# Patient Record
Sex: Male | Born: 1955 | Race: Black or African American | Hispanic: No | Marital: Married | Smoking: Never smoker
Health system: Southern US, Community
[De-identification: ages and names within clinical notes are randomized; demographics above are authoritative.]

## PROBLEM LIST (undated history)

## (undated) DIAGNOSIS — M1990 Unspecified osteoarthritis, unspecified site: Secondary | ICD-10-CM

## (undated) DIAGNOSIS — C801 Malignant (primary) neoplasm, unspecified: Secondary | ICD-10-CM

## (undated) DIAGNOSIS — I10 Essential (primary) hypertension: Secondary | ICD-10-CM

## (undated) HISTORY — PX: CHOLECYSTECTOMY: SHX55

---

## 2010-08-20 ENCOUNTER — Emergency Department (HOSPITAL_COMMUNITY): Admission: EM | Admit: 2010-08-20 | Discharge: 2010-08-20 | Payer: Self-pay | Admitting: Emergency Medicine

## 2011-01-01 ENCOUNTER — Encounter: Payer: Self-pay | Admitting: Pain Medicine

## 2011-02-23 LAB — POCT I-STAT, CHEM 8
BUN: 7 mg/dL (ref 6–23)
Calcium, Ion: 1.16 mmol/L (ref 1.12–1.32)
Chloride: 105 meq/L (ref 96–112)
Creatinine, Ser: 1.3 mg/dL (ref 0.4–1.5)
Glucose, Bld: 94 mg/dL (ref 70–99)
HCT: 50 % (ref 39.0–52.0)
Hemoglobin: 17 g/dL (ref 13.0–17.0)
Potassium: 4.2 meq/L (ref 3.5–5.1)
Sodium: 141 meq/L (ref 135–145)
TCO2: 28 mmol/L (ref 0–100)

## 2011-02-23 LAB — POCT CARDIAC MARKERS
CKMB, poc: <1 ng/mL — ABNORMAL LOW (ref 1.0–8.0)
CKMB, poc: <1 ng/mL — ABNORMAL LOW (ref 1.0–8.0)
Myoglobin, poc: 47.4 ng/mL (ref 12–200)
Myoglobin, poc: 53.9 ng/mL (ref 12–200)
Troponin i, poc: <0.05 ng/mL (ref 0.00–0.09)
Troponin i, poc: <0.05 ng/mL (ref 0.00–0.09)

## 2011-09-14 IMAGING — CR DG CHEST 1V PORT
1 series · 1 of 1 positions shown · non-contrast
Comparison: None.

CLINICAL DATA: Left chest pain and neck pain.

CHEST - 1 VIEW

[AP]
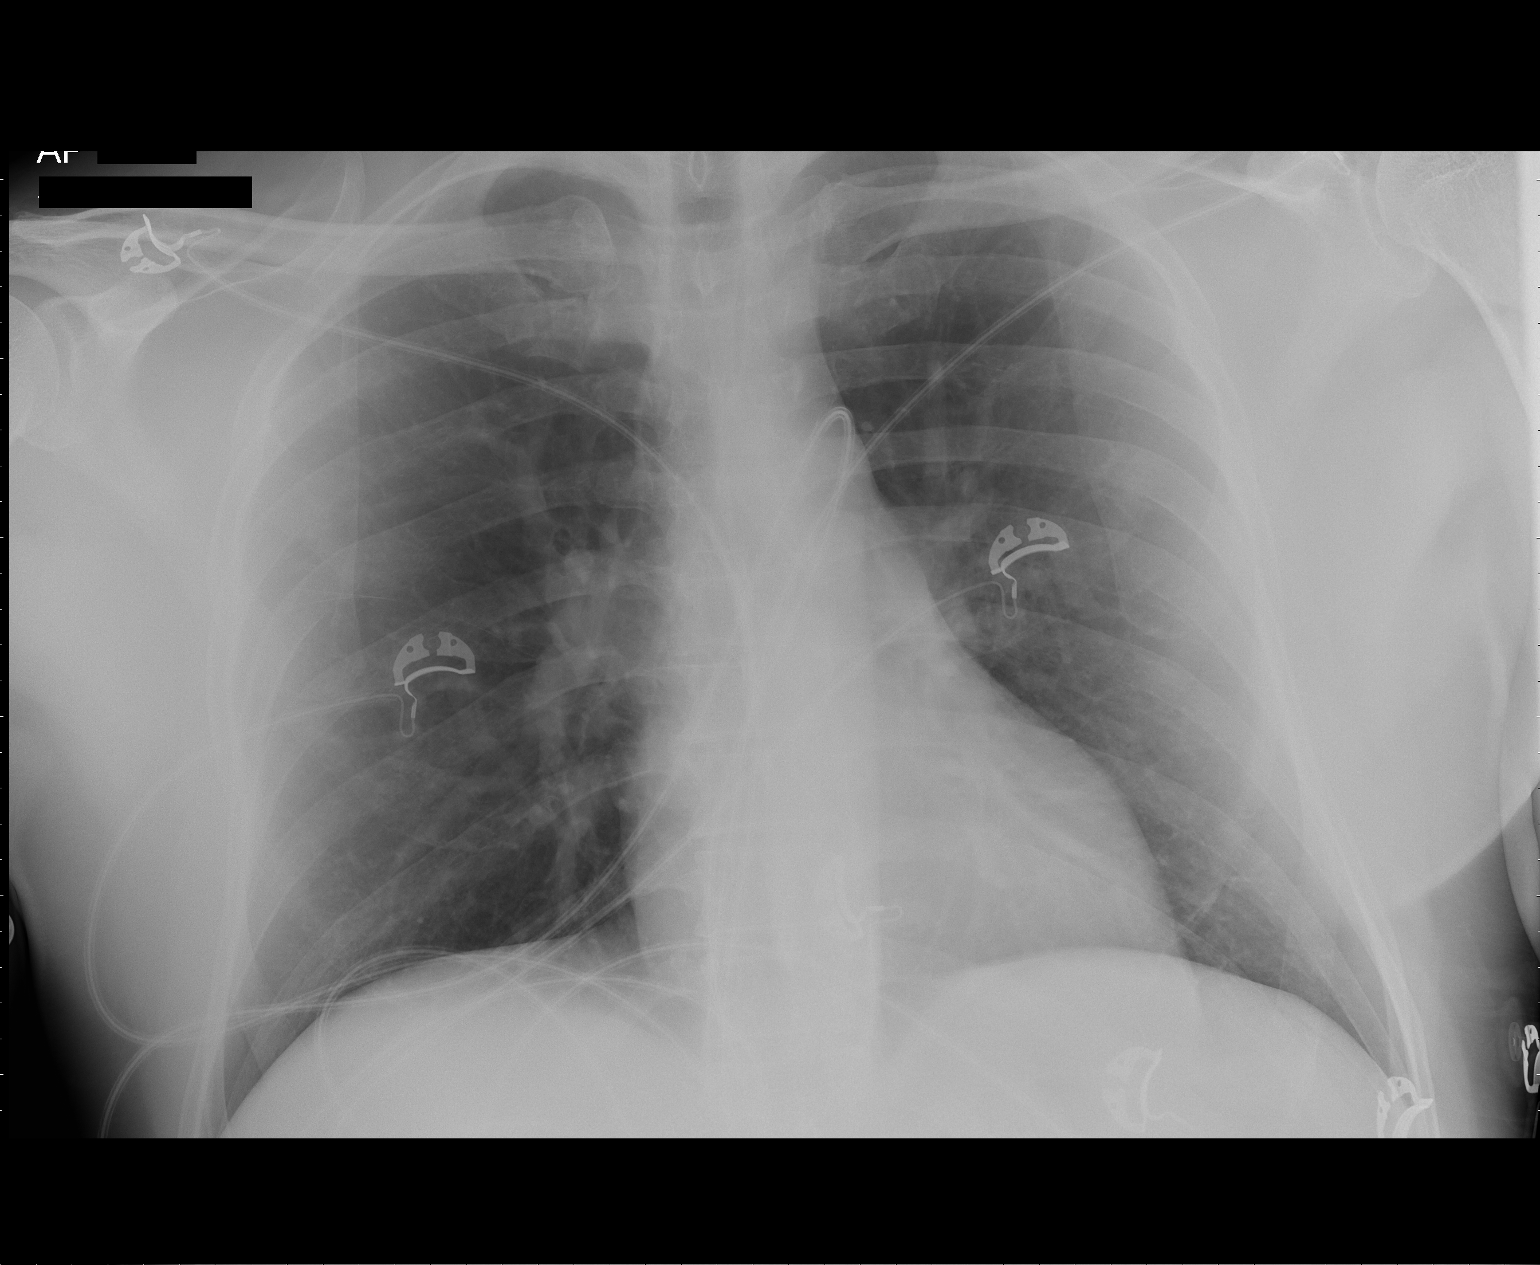

[1 of 1 positions shown; findings below may reference images not displayed]

FINDINGS: The heart size and mediastinal contours are within normal
limits.  Both lungs are clear.
IMPRESSION: No active disease.

## 2017-07-11 HISTORY — PX: PROSTATECTOMY: SHX69

## 2019-01-05 ENCOUNTER — Emergency Department (HOSPITAL_COMMUNITY)

## 2019-01-05 ENCOUNTER — Encounter (HOSPITAL_COMMUNITY): Payer: Self-pay | Admitting: *Deleted

## 2019-01-05 ENCOUNTER — Emergency Department (HOSPITAL_COMMUNITY)
Admission: EM | Admit: 2019-01-05 | Discharge: 2019-01-05 | Disposition: A | Discharge status: Home / Self Care | Attending: Emergency Medicine | Admitting: Emergency Medicine

## 2019-01-05 ENCOUNTER — Other Ambulatory Visit: Payer: Self-pay

## 2019-01-05 DIAGNOSIS — R079 Chest pain, unspecified: Secondary | ICD-10-CM | POA: Diagnosis not present

## 2019-01-05 DIAGNOSIS — Z8546 Personal history of malignant neoplasm of prostate: Secondary | ICD-10-CM | POA: Insufficient documentation

## 2019-01-05 DIAGNOSIS — Z79899 Other long term (current) drug therapy: Secondary | ICD-10-CM | POA: Insufficient documentation

## 2019-01-05 DIAGNOSIS — I10 Essential (primary) hypertension: Secondary | ICD-10-CM | POA: Diagnosis not present

## 2019-01-05 HISTORY — DX: Essential (primary) hypertension: I10

## 2019-01-05 HISTORY — DX: Unspecified osteoarthritis, unspecified site: M19.90

## 2019-01-05 HISTORY — DX: Malignant (primary) neoplasm, unspecified: C80.1

## 2019-01-05 LAB — CBC
HCT: 47.5 % (ref 39.0–52.0)
HEMOGLOBIN: 15.7 g/dL (ref 13.0–17.0)
MCH: 27.7 pg (ref 26.0–34.0)
MCHC: 33.1 g/dL (ref 30.0–36.0)
MCV: 83.9 fL (ref 80.0–100.0)
Platelets: 139 10*3/uL — ABNORMAL LOW (ref 150–400)
RBC: 5.66 MIL/uL (ref 4.22–5.81)
RDW: 12.1 % (ref 11.5–15.5)
WBC: 6.8 10*3/uL (ref 4.0–10.5)
nRBC: 0 % (ref 0.0–0.2)

## 2019-01-05 LAB — BASIC METABOLIC PANEL
Anion gap: 8 (ref 5–15)
BUN: 13 mg/dL (ref 8–23)
CALCIUM: 9.1 mg/dL (ref 8.9–10.3)
CO2: 28 mmol/L (ref 22–32)
Chloride: 104 mmol/L (ref 98–111)
Creatinine, Ser: 1.08 mg/dL (ref 0.61–1.24)
GFR calc Af Amer: >60 mL/min (ref >60)
GLUCOSE: 92 mg/dL (ref 70–99)
POTASSIUM: 3.7 mmol/L (ref 3.5–5.1)
Sodium: 140 mmol/L (ref 135–145)

## 2019-01-05 LAB — I-STAT TROPONIN, ED
TROPONIN I, POC: 0 ng/mL (ref 0.00–0.08)
Troponin i, poc: 0 ng/mL (ref 0.00–0.08)

## 2019-01-05 MED ORDER — SODIUM CHLORIDE 0.9% FLUSH: 3.0000 mL | Freq: Once | INTRAVENOUS | Status: DC

## 2019-01-05 NOTE — ED Notes (Signed)
Patient stepped out with family; stated he would be right back;

## 2019-01-05 NOTE — ED Triage Notes (Signed)
Pt reports he has had central chest discomfort and "fluttering" for about a week. He said he checked his heart rate tonight and it went down to 43 at one point then returned to 65, denies dizziness. Pt was told this week that his hgb levels are low and to follow up with a specialist.

## 2019-01-05 NOTE — ED Notes (Signed)
The pt feels strange tonight  In his chest no pain

## 2019-01-05 NOTE — ED Provider Notes (Addendum)
[facility] EMERGENCY DEPARTMENT Provider Note   CSN: [id number] Arrival date & time: 01/05/19  0156     History   Chief Complaint Chief Complaint  Patient presents with  . Chest Pain    HPI Eugene Marshall is a 63 y.o. male.  HPI Eugene Marshall is a 63 y.o. male presents to emergency department with complaint of chest discomfort.  Patient states for the last week he has had intermittent chest tightness which she describes as "numbness feeling."  He states this comes and goes.  It is not exertional.  Denies any associated shortness of breath.  No dizziness or lightheadedness.  He states last night he had some palpitations as well and when he checked his heart rate he was in the 40s and 30s so he decided to come here.  He denies any heart problems but states that he did have a stress test and cardiac cath done about 10 years ago and was told everything was normal.  He does not smoke cigarettes.  He states his mother has had heart problems otherwise no other family history.  He does not drink alcohol.  He has history of well-controlled hypertension.  No personal cardiac history.  Denies recent travel or surgeries.  No swelling in extremities.  No exertional chest pain or shortness of breath.  Past Medical History:  Diagnosis Date  . Arthritis   . Cancer [facility])    prostate  . Hypertension     There are no active problems to display for this patient.   Past Surgical History:  Procedure Laterality Date  . CHOLECYSTECTOMY          Home Medications    Prior to Admission medications   Medication Sig Start Date End Date Taking? Authorizing Provider  Multiple Vitamin (MULTIVITAMIN WITH MINERALS) TABS tablet Take 1 tablet by mouth daily.   Yes [provider]  tadalafil (CIALIS) 5 MG tablet Take 5 mg by mouth daily. 09/11/18  Yes [provider]    Family History No family history on file.  Social History Social History   Tobacco Use  .  Smoking status: Never Smoker  Substance Use Topics  . Alcohol use: Yes  . Drug use: Yes    Types: Marijuana     Allergies   Sulfacetamide and Penicillins   Review of Systems Review of Systems  Constitutional: Negative for chills and fever.  Respiratory: Positive for chest tightness. Negative for shortness of breath.   Cardiovascular: Negative for chest pain.  Gastrointestinal: Negative for abdominal pain, nausea and vomiting.  Musculoskeletal: Negative for back pain and myalgias.  Skin: Negative for rash.  Neurological: Negative for dizziness and light-headedness.  All other systems reviewed and are negative.    Physical Exam Updated Vital Signs BP (!) 143/79   Pulse (!) 51   Temp 97.8 F (36.6 C) (Oral)   Resp 13   SpO2 98%   Physical Exam Vitals signs and nursing note reviewed.  Constitutional:      General: He is not in acute distress.    Appearance: He is well-developed.  HENT:     Head: Normocephalic and atraumatic.  Eyes:     Conjunctiva/sclera: Conjunctivae normal.  Neck:     Musculoskeletal: Neck supple.  Cardiovascular:     Rate and Rhythm: Normal rate and regular rhythm.     Heart sounds: Normal heart sounds.  Pulmonary:     Effort: Pulmonary effort is normal. No respiratory distress.  Breath sounds: Normal breath sounds. No wheezing or rales.  Abdominal:     General: Bowel sounds are normal. There is no distension.     Palpations: Abdomen is soft.     Tenderness: There is no abdominal tenderness. There is no rebound.  Skin:    General: Skin is warm and dry.  Neurological:     Mental Status: He is alert.      ED Treatments / Results  Labs (all labs ordered are listed, but only abnormal results are displayed) Labs Reviewed  CBC - Abnormal; Notable for the following components:      Result Value   Platelets 139 (*)    All other components within normal limits  BASIC METABOLIC PANEL  I-STAT TROPONIN, ED  I-STAT TROPONIN, ED     EKG EKG Interpretation  Date/Time:  Sunday January 05 2019 02:05:37 EST Ventricular Rate:  63 PR Interval:  146 QRS Duration: 84 QT Interval:  366 QTC Calculation: 374 R Axis:   51 Text Interpretation:  Normal sinus rhythm with sinus arrhythmia Nonspecific T wave abnormality Abnormal ECG No significant change was found Nonspecific T wave abnormality Reconfirmed by Ezequiel Essex [phone]) on 01/05/2019 5:19:58 AM   Radiology Dg Chest 2 View  Result Date: 01/05/2019 CLINICAL DATA:  Central chest pain and pressure x1 week EXAM: CHEST - 2 VIEW COMPARISON:  05/15/2017 FINDINGS: The heart size and mediastinal contours are within normal limits. Both lungs are clear. The visualized skeletal structures are unremarkable. IMPRESSION: No active cardiopulmonary disease. Electronically Signed   By: Ashley Royalty M.D.   On: 01/05/2019 03:01    Procedures Procedures (including critical care time)  Medications Ordered in ED Medications  sodium chloride flush (NS) 0.9 % injection 3 mL (has no administration in time range)     Initial Impression / Assessment and Plan / ED Course  I have reviewed the triage vital signs and the nursing notes.  Pertinent labs & imaging results that were available during my care of the patient were reviewed by me and considered in my medical decision making (see chart for details).     Pt in ED with chest discomfort. Symptoms atypical. ECG showing some T wave abnormalities, no old to compare to. Heart score 3. Labs all unremarkable. Trop x2 negative. Pain is not exertional. Not radiating. Neurovascularly intact. No risks or concern for PE or dissection.   Pt dos have PVCs on the monitor, otherwise no arrhythmias on the monitor over last few hours.   Discussed with Dr. Billy Fischer, pt stable for dc home at this time. Pt will call his PCP tomorrow for close follow up. Discussed strict return precautions. Pt voiced understanding.  Vitals:   01/05/19 0201 01/05/19  0517 01/05/19 0530  BP: (!) 144/79 122/78 (!) 143/79  Pulse: 63 64 (!) 51  Resp: 18 19 13   Temp: 97.6 F (36.4 C) 97.8 F (36.6 C)   TempSrc: Oral Oral   SpO2: 99% 100% 98%      Final Clinical Impressions(s) / ED Diagnoses   Final diagnoses:  Nonspecific chest pain    ED Discharge Orders    None       Jeannett Senior, PA-C 01/05/19 0735    Jeannett Senior, PA-C 01/05/19 7408    Gareth Morgan, MD 01/07/19 2157

## 2019-01-05 NOTE — Discharge Instructions (Addendum)
Please follow up with your family doctor for further testing. Return if worsening symptoms.

## 2019-11-04 ENCOUNTER — Other Ambulatory Visit: Payer: Self-pay

## 2019-11-04 DIAGNOSIS — Z20822 Contact with and (suspected) exposure to covid-19: Secondary | ICD-10-CM

## 2019-11-06 LAB — NOVEL CORONAVIRUS, NAA: SARS-CoV-2, NAA: NOT DETECTED

## 2020-01-22 ENCOUNTER — Other Ambulatory Visit: Payer: Self-pay | Admitting: Orthopedic Surgery

## 2020-01-22 DIAGNOSIS — M259 Joint disorder, unspecified: Secondary | ICD-10-CM

## 2020-01-30 IMAGING — CR DG CHEST 2V
2 series · 2 of 2 positions shown · non-contrast
Comparison: 05/15/2017

CLINICAL DATA: Central chest pain and pressure x1 week

EXAM:
CHEST - 2 VIEW

[chest pa]
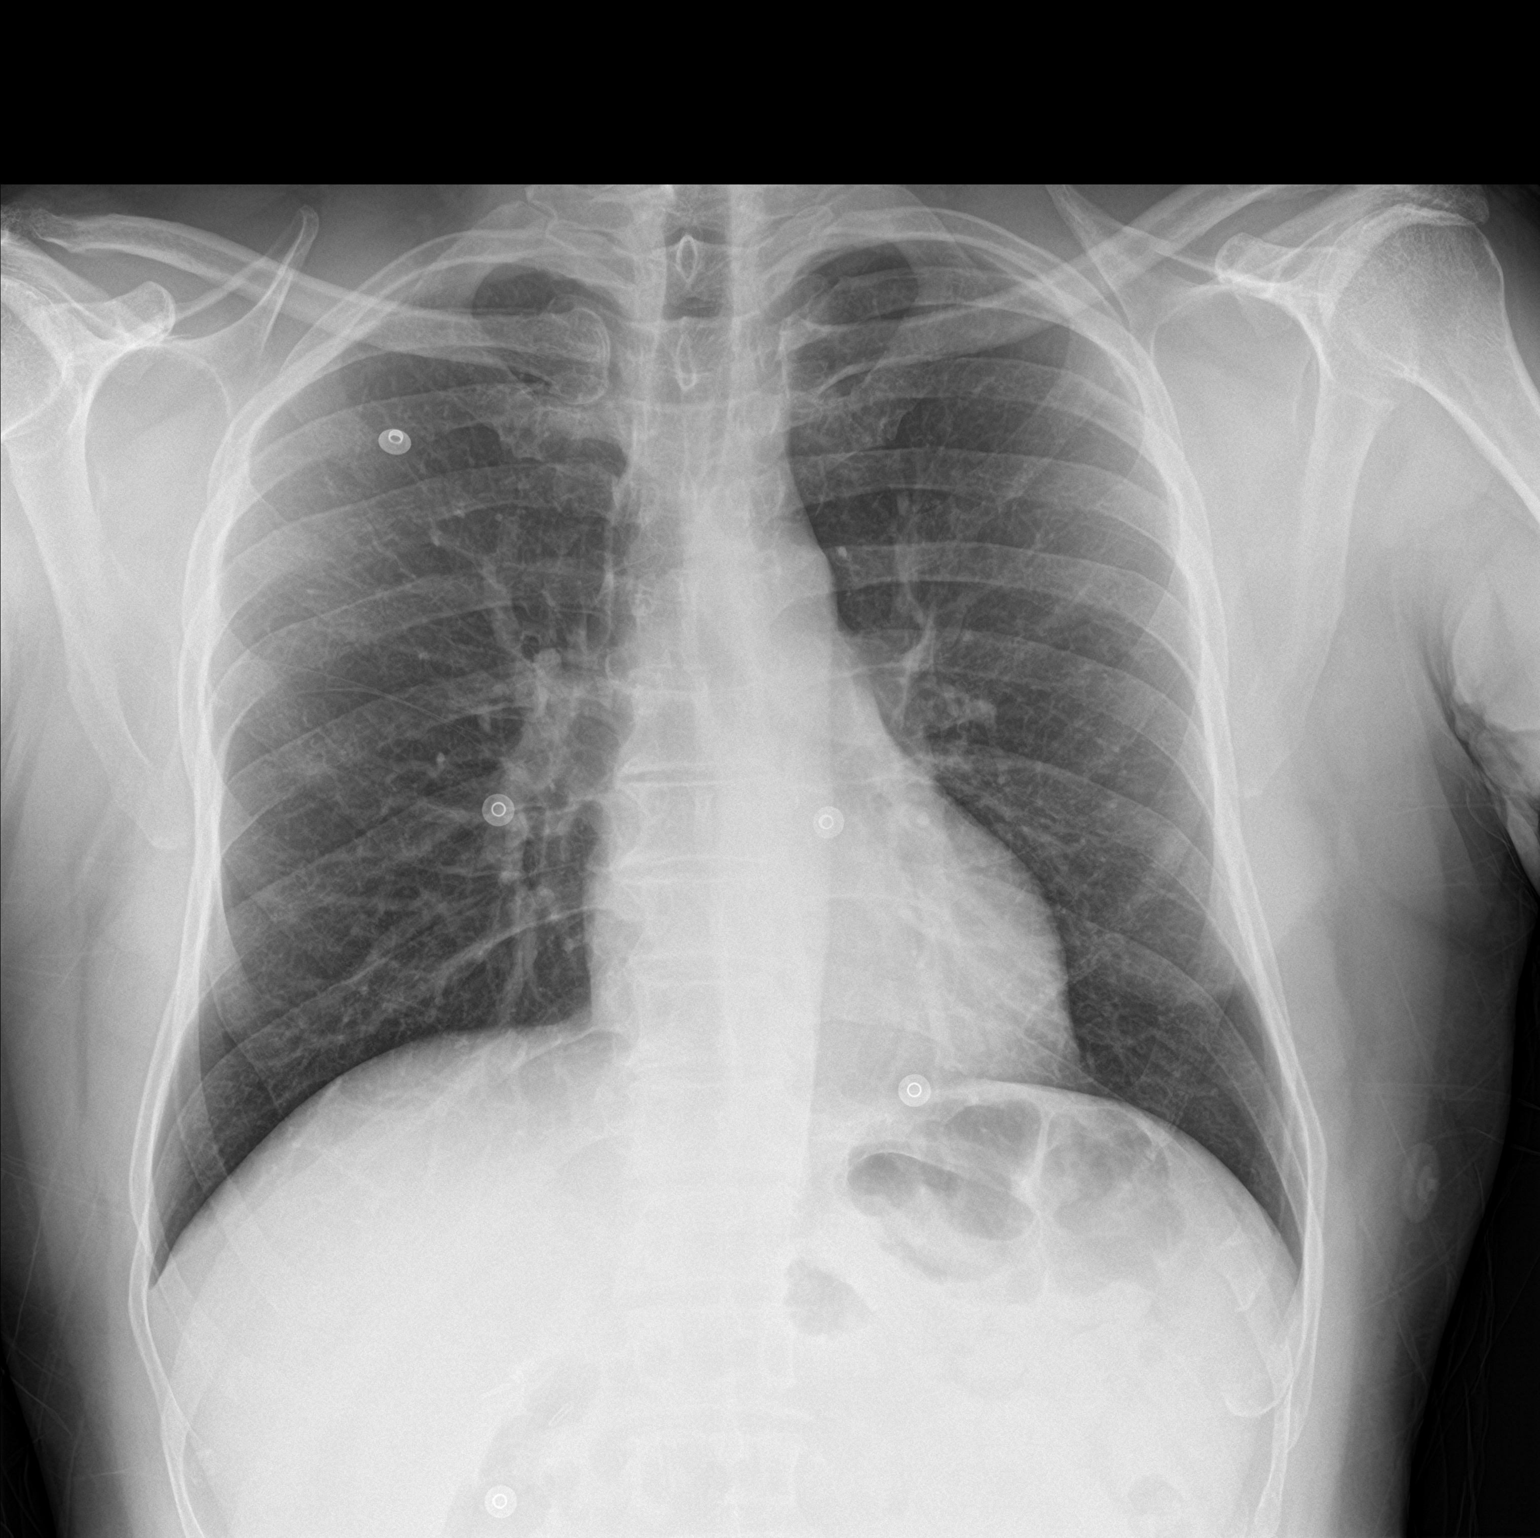

[chest lat]
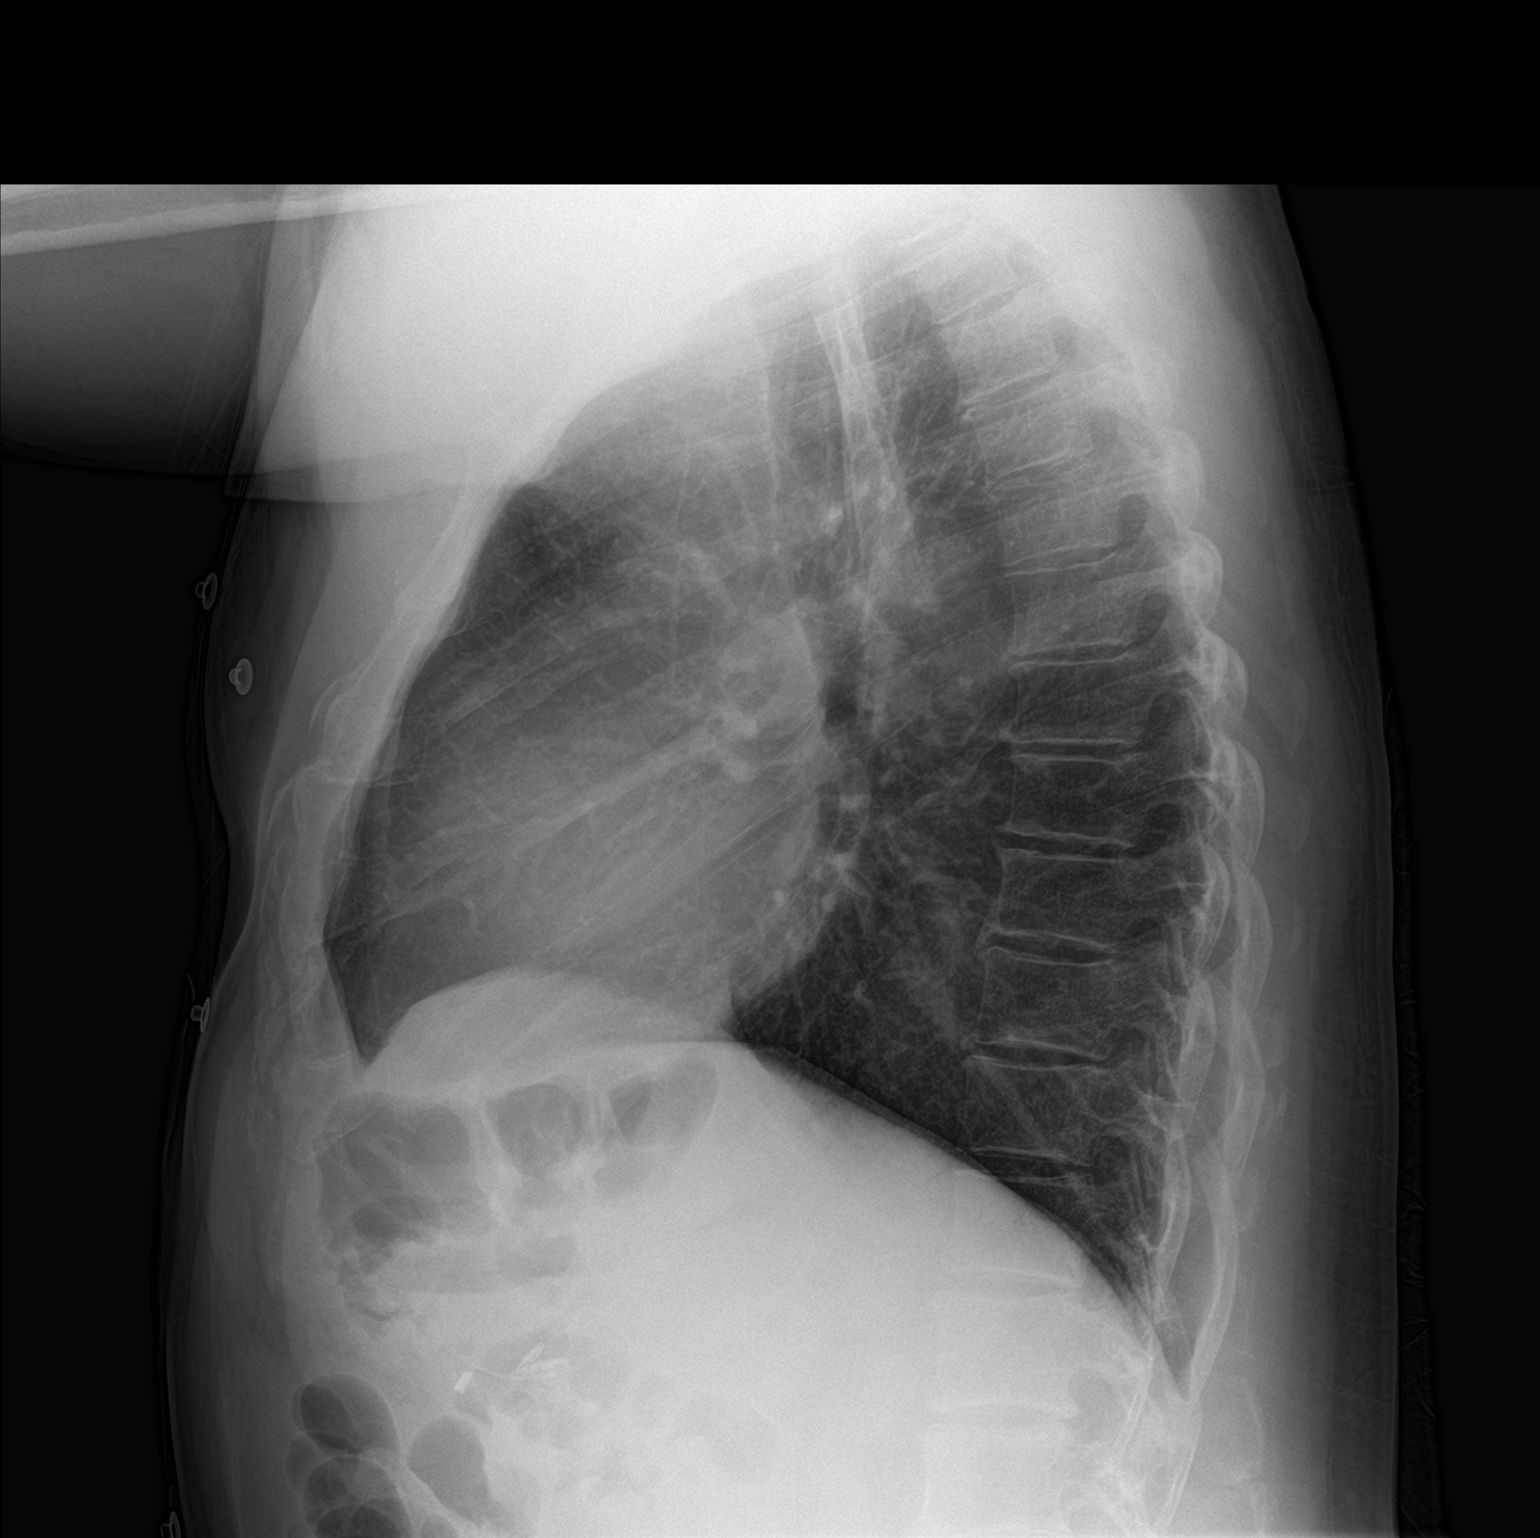

[2 of 2 positions shown; findings below may reference images not displayed]

FINDINGS: The heart size and mediastinal contours are within normal limits.
Both lungs are clear. The visualized skeletal structures are
unremarkable.
IMPRESSION: No active cardiopulmonary disease.

## 2020-01-31 ENCOUNTER — Other Ambulatory Visit: Payer: Self-pay | Admitting: Orthopedic Surgery

## 2020-01-31 DIAGNOSIS — M545 Low back pain, unspecified: Secondary | ICD-10-CM

## 2020-02-09 ENCOUNTER — Ambulatory Visit: Attending: Internal Medicine

## 2020-02-09 DIAGNOSIS — Z23 Encounter for immunization: Secondary | ICD-10-CM

## 2020-02-09 NOTE — Progress Notes (Signed)
   Covid-19 Vaccination Clinic  Name:  Eugene Marshall    MRN: [id number] DOB: [birth year]  02/09/2020  Eugene Marshall was observed post Covid-19 immunization for 15 minutes without incidence. He was provided with Vaccine Information Sheet and instruction to access the V-Safe system.   Eugene Marshall was instructed to call 911 with any severe reactions post vaccine: [name] Kitchen Difficulty breathing  . Swelling of your face and throat  . A fast heartbeat  . A bad rash all over your body  . Dizziness and weakness    Immunizations Administered    Name Date Dose VIS Date Route   Pfizer COVID-19 Vaccine 02/09/2020  2:41 PM 0.3 mL 11/21/2019 Intramuscular   Manufacturer: [place]   Lot: [id number]   [place]: [id number]

## 2020-02-26 ENCOUNTER — Ambulatory Visit
Admission: RE | Admit: 2020-02-26 | Discharge: 2020-02-26 | Disposition: A | Discharge status: Home / Self Care | Source: Ambulatory Visit | Attending: Orthopedic Surgery | Admitting: Orthopedic Surgery

## 2020-02-26 DIAGNOSIS — M545 Low back pain, unspecified: Secondary | ICD-10-CM

## 2020-03-03 ENCOUNTER — Ambulatory Visit: Attending: Internal Medicine

## 2020-03-03 DIAGNOSIS — Z23 Encounter for immunization: Secondary | ICD-10-CM

## 2020-03-03 NOTE — Progress Notes (Signed)
   Covid-19 Vaccination Clinic  Name:  [name]    MRN: [id number] DOB: Jul 19, 1956  03/03/2020  Mr. Presutti was observed post Covid-19 immunization for 15 minutes without incident. He was provided with Vaccine Information Sheet and instruction to access the V-Safe system.   Mr. Baade was instructed to call 911 with any severe reactions post vaccine: [name] Kitchen Difficulty breathing  . Swelling of face and throat  . A fast heartbeat  . A bad rash all over body  . Dizziness and weakness   Immunizations Administered    Name Date Dose VIS Date Route   Pfizer COVID-19 Vaccine 03/03/2020  3:23 PM 0.3 mL 11/21/2019 Intramuscular   Manufacturer: [place]   Lot: [id number]   [place]: [id number]

## 2020-09-07 ENCOUNTER — Ambulatory Visit: Payer: Self-pay | Attending: Internal Medicine

## 2020-09-07 DIAGNOSIS — Z23 Encounter for immunization: Secondary | ICD-10-CM

## 2020-09-07 NOTE — Progress Notes (Signed)
   Covid-19 Vaccination Clinic  Name:  Eugene Marshall    MRN: [id number] DOB: 03/14/1956  09/07/2020  Mr. [name] was observed post Covid-19 immunization for 15 minutes without incident. He was provided with Vaccine Information Sheet and instruction to access the V-Safe system.   Mr. [name] was instructed to call 911 with any severe reactions post vaccine: [name] Kitchen Difficulty breathing  . Swelling of face and throat  . A fast heartbeat  . A bad rash all over body  . Dizziness and weakness

## 2021-02-07 ENCOUNTER — Telehealth: Payer: Self-pay | Admitting: Oncology

## 2021-02-07 NOTE — Telephone Encounter (Signed)
Received a new hem referral from Dr. Ronnald Ramp for thrombocytopenia. Mr. Eugene Marshall has been cld and scheduled to see Dr. Alen Blew on 3/9 at 11am. Pt aware to arrrive 20 minutes early.

## 2021-02-16 ENCOUNTER — Other Ambulatory Visit: Payer: Self-pay

## 2021-02-16 ENCOUNTER — Inpatient Hospital Stay: Payer: Medicare Other | Attending: Oncology | Admitting: Oncology

## 2021-02-16 VITALS — BP 146/87 | HR 67 | Temp 98.1°F | Resp 17 | Wt 175.2 lb

## 2021-02-16 DIAGNOSIS — D696 Thrombocytopenia, unspecified: Secondary | ICD-10-CM | POA: Diagnosis present

## 2021-02-16 DIAGNOSIS — Z8546 Personal history of malignant neoplasm of prostate: Secondary | ICD-10-CM | POA: Diagnosis not present

## 2021-02-16 NOTE — Progress Notes (Signed)
Reason for the request:    Thrombocytopenia  HPI: I was asked by Dr. Ronnald Ramp to evaluate Eugene Marshall for the evaluation of low platelet count.  He is a 65 year old man with history of hypertension and history of prostate cancer diagnosed in 2019.  He underwent a radical prostatectomy at [facility].  He has been told in the past that he had thrombocytopenia and was evaluated by hematology at [facility] diagnosed with platelet clumping. Laboratory testing on January 27 showed a platelet count of 68.  On February 3 CBC was repeated and showed a platelet count of 117 and was 119 in February 23rd 2022.  Previous CBC on January 2020 showed a platelet count of 139.  On January 10 of 04/30/2019 spell count was 75 and increase spontaneously in July 20 to 138 and subsequently to 843 in May 2021. At [place] he had a CBC done in 2017 platelet count 139.  Clinically, he reports feeling well without any complaints.  Denies any hematochezia, melena or hemoptysis.  He remains active and continues to attempt activities of daily living to live independently.  He does not report any headaches, blurry vision, syncope or seizures. Does not report any fevers, chills or sweats.  Does not report any cough, wheezing or hemoptysis.  Does not report any chest pain, palpitation, orthopnea or leg edema.  Does not report any nausea, vomiting or abdominal pain.  Does not report any constipation or diarrhea.  Does not report any skeletal complaints.    Does not report frequency, urgency or hematuria.  Does not report any skin rashes or lesions. Does not report any heat or cold intolerance.  Does not report any lymphadenopathy or petechiae.  Does not report any anxiety or depression.  Remaining review of systems is negative.    Past Medical History:  Diagnosis Date   Arthritis    Cancer ([place])    prostate   Hypertension   :  Past Surgical History:  Procedure Laterality Date   CHOLECYSTECTOMY    :   Current  Outpatient Medications:    Multiple Vitamin (MULTIVITAMIN WITH MINERALS) TABS tablet, Take 1 tablet by mouth daily., Disp: , Rfl:    tadalafil (CIALIS) 5 MG tablet, Take 5 mg by mouth daily., Disp: , Rfl: :  Allergies  Allergen Reactions   Sulfacetamide Hives   Penicillins Other (See Comments)    Other reaction(s): Other (See Comments) Stopped breathing Stopped breathing Stopped breathing   :  No family history on file.:  Social History   Socioeconomic History   Marital status: Married    Spouse name: Not on file   Number of children: Not on file   Years of education: Not on file   Highest education level: Not on file  Occupational History   Not on file  Tobacco Use   Smoking status: Never Smoker   Smokeless tobacco: Not on file  Substance and Sexual Activity   Alcohol use: Yes   Drug use: Yes    Types: Marijuana   Sexual activity: Not on file  Other Topics Concern   Not on file  Social History Narrative   Not on file   Social Determinants of Health   Financial Resource Strain: Not on file  Food Insecurity: Not on file  Transportation Needs: Not on file  Physical Activity: Not on file  Stress: Not on file  Social Connections: Not on file  Intimate Partner Violence: Not on file  :  Pertinent items are  noted in HPI.  Exam: Blood pressure (!) 146/87, pulse 67, temperature 98.1 F (36.7 C), temperature source Tympanic, resp. rate 17, weight 175 lb 3.2 oz (79.5 kg), SpO2 100 %.  ECOG 1  General appearance: alert and cooperative appeared without distress. Head: atraumatic without any abnormalities. Eyes: conjunctivae/corneas clear. PERRL.  Sclera anicteric. Throat: lips, mucosa, and tongue normal; without oral thrush or ulcers. Resp: clear to auscultation bilaterally without rhonchi, wheezes or dullness to percussion. Cardio: regular rate and rhythm, S1, S2 normal, no murmur, click, rub or gallop GI: soft, non-tender; bowel sounds normal; no  masses,  no organomegaly Skin: Skin color, texture, turgor normal. No rashes or lesions Lymph nodes: Cervical, supraclavicular, and axillary nodes normal. Neurologic: Grossly normal without any motor, sensory or deep tendon reflexes. Musculoskeletal: No joint deformity or effusion.    Assessment and Plan:   65 year old man with:  1.  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  His most recent CBC obtained on February 02, 2021 showed a platelet count of 122 with normal white cell count, hemoglobin and differential.  Differential diagnosis of these findings were discussed at this time.  Given the mild and fluctuating nature of his thrombocytopenia it is unlikely we are dealing with a malignant hematological condition.  Conditions such as ITP, splenic sequestration, pseudothrombocytopenia due to clumping is more likely at this time.  Conditions such as leukemia, lymphoma, MDS among others are considered unlikely.  Given the fact that his platelet count is above 100,000 without any signs or symptoms of bleeding, no intervention is needed at this time.  I recommended repeating his CBC in 6 months and if the pattern continues to hold no further work-up is needed.  I see no need for any growth factor support, transfusion or any further testing at this time including a bone marrow biopsy.  I see no evidence of a hematological disorder at this time.  Anticipates his platelet count will continue to fluctuate based on readings and likely related to specimen collection rather than actual hematological disorder.   2.  Follow-up: I am happy to see him in the future as needed.  45  minutes were dedicated to this visit. The time was spent on reviewing laboratory data, discussing treatment options, discussing differential diagnosis and answering questions regarding future plan.    A copy of this consult has been forwarded to  the requesting physician.

## 2021-03-22 IMAGING — MR MR LUMBAR SPINE W/O CM
4 of 5 series · 24 of 48 positions shown · non-contrast
Comparison: None.

CLINICAL DATA: Low back pain radiating to the right hip and buttock

EXAM:
MRI LUMBAR SPINE WITHOUT CONTRAST
TECHNIQUE: Multiplanar, multisequence MR imaging of the lumbar spine was
performed. No intravenous contrast was administered.

[Series 2: T2 · sagittal · 4.0mm · 0.55mm/px · 6 of 16 slices shown (1 of 2)]
[im 1/16]
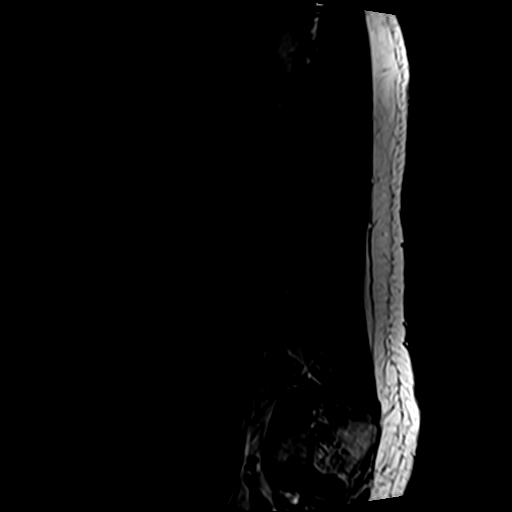
[im 4/16]
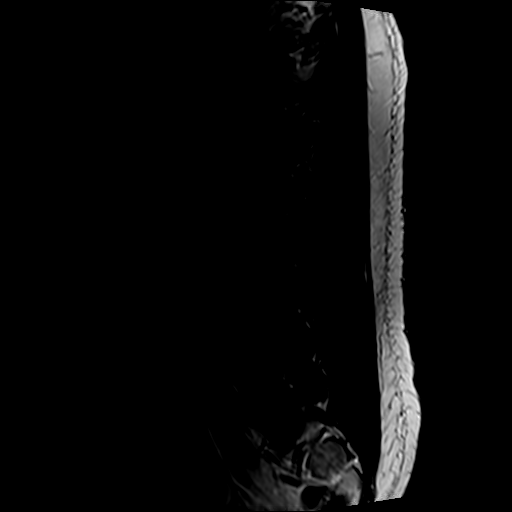
[im 7/16]
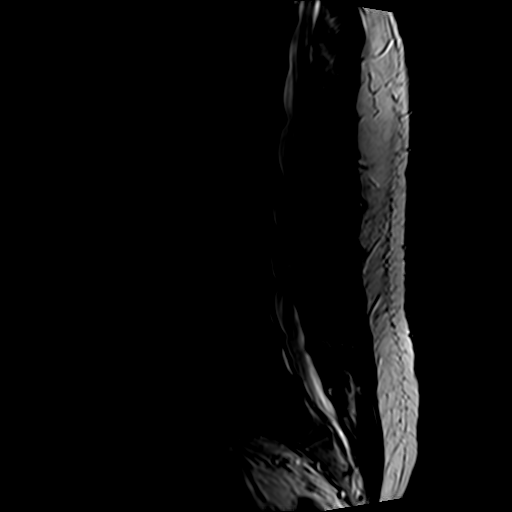
[im 10/16]
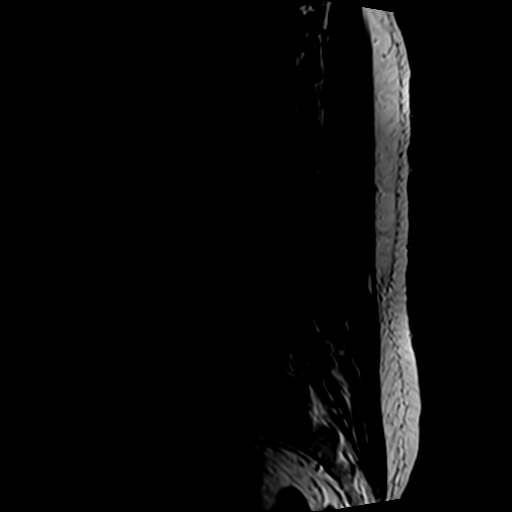
[im 13/16]
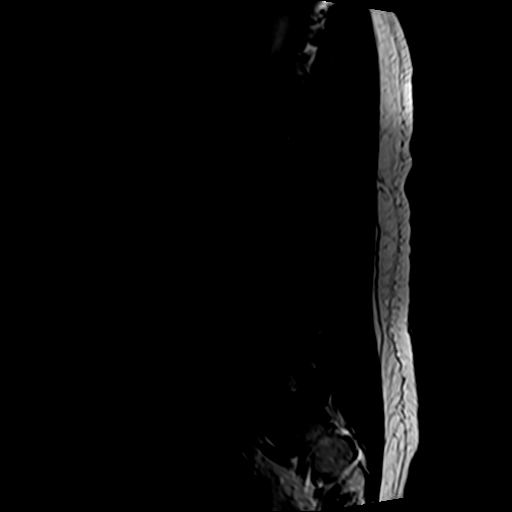
[im 16/16]
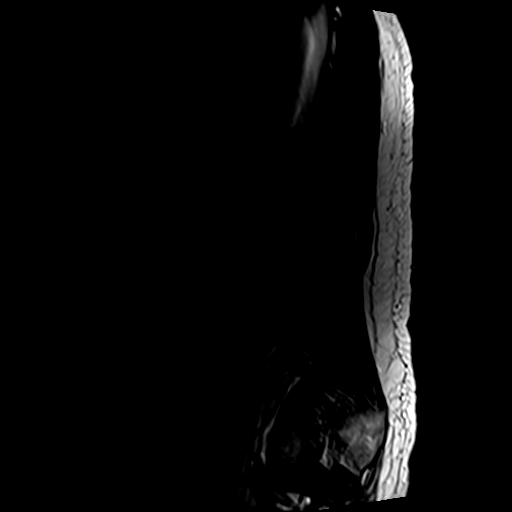

[Series 4: T1 · sagittal · 4.0mm · 0.55mm/px · 7 of 16 slices shown (1 of 2)]
[im 1/16]
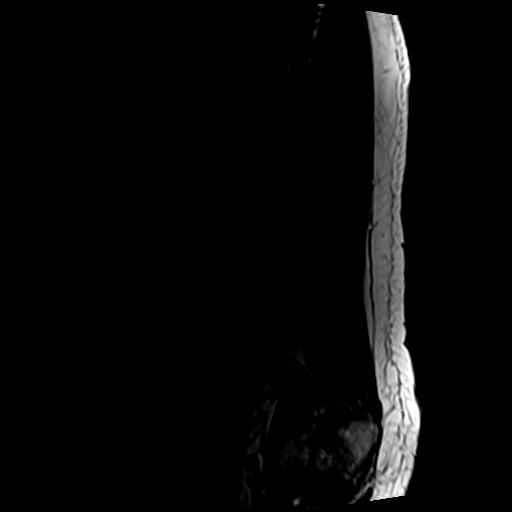
[im 3/16]
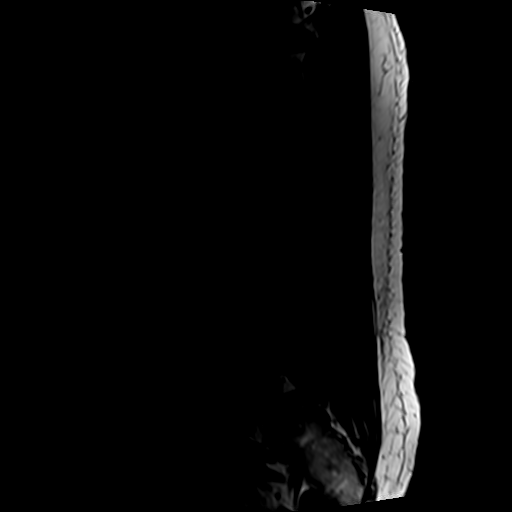
[im 6/16]
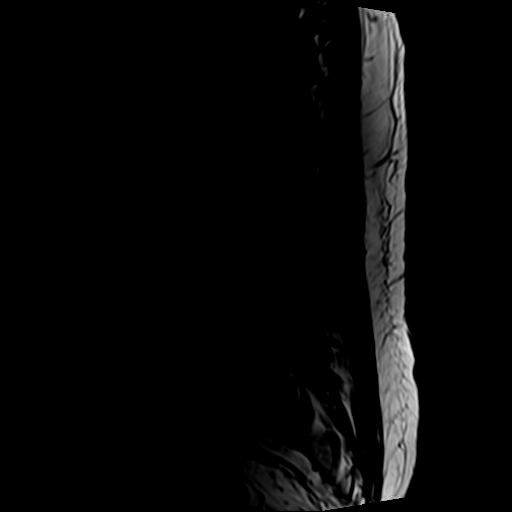
[im 8/16]
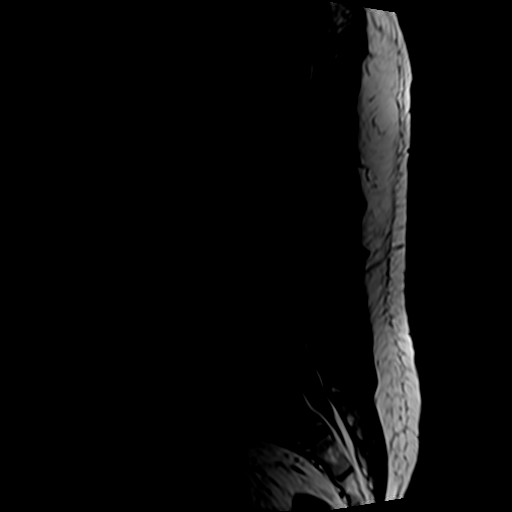
[im 11/16]
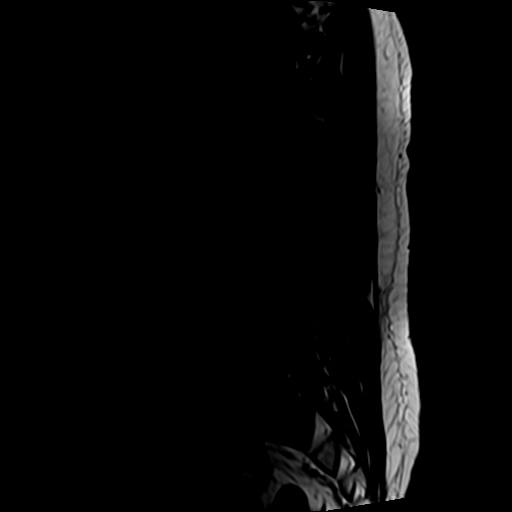
[im 13/16]
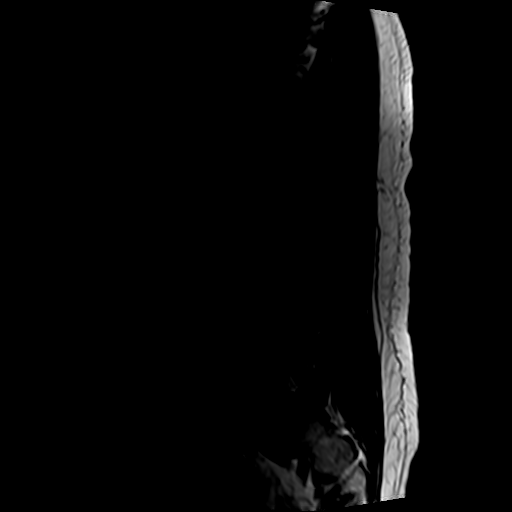
[im 16/16]
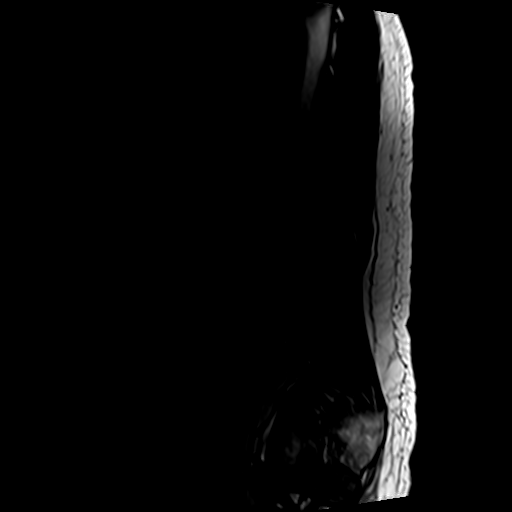

[Series 5: T2 · axial · 4.0mm · 0.70mm/px · z∈[-67,+129]mm · 8 of 35 slices shown (2 of 2)]
[im 1/35]
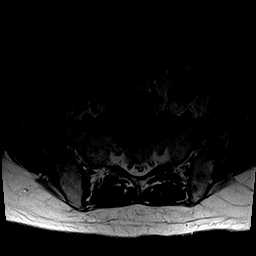
[im 6/35]
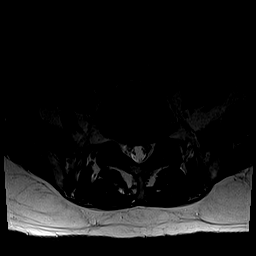
[im 11/35]
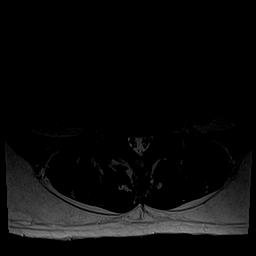
[im 16/35]
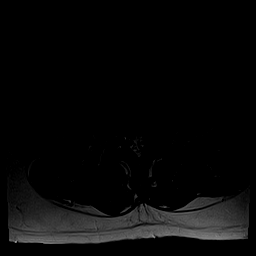
[im 19/35]
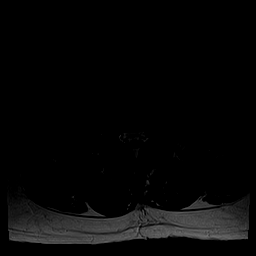
[im 24/35]
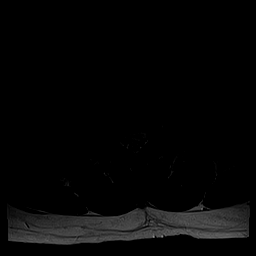
[im 29/35]
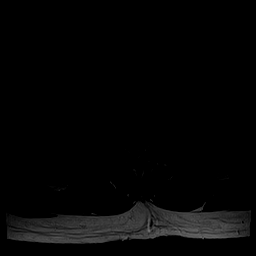
[im 35/35]
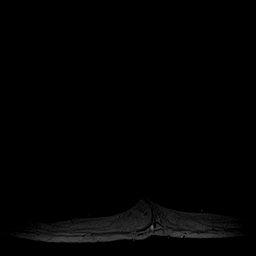

[Series 6: T1 · axial · 4.0mm · 0.35mm/px · z∈[-42,+98]mm · 3 of 35 slices shown (2 of 2)]
[im 6/35]
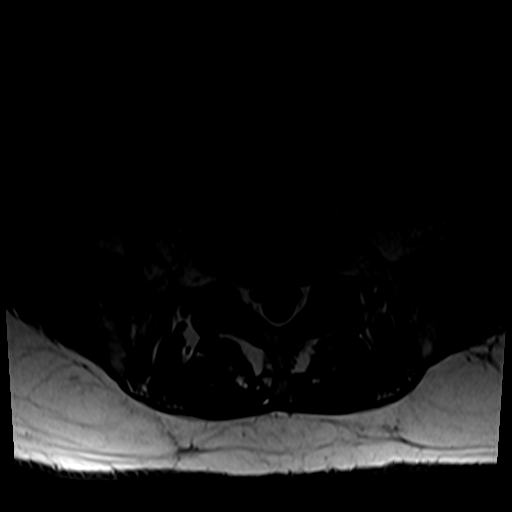
[im 19/35]
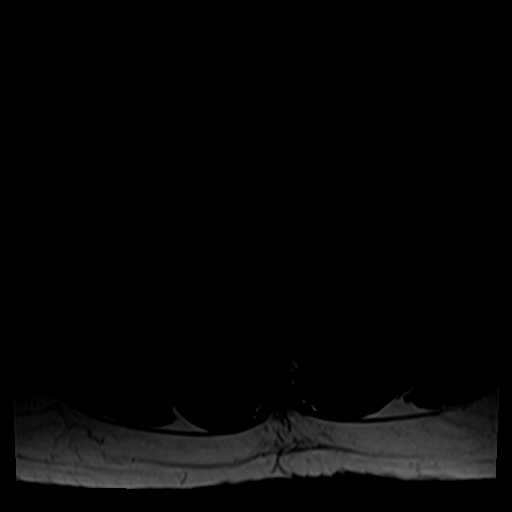
[im 29/35]
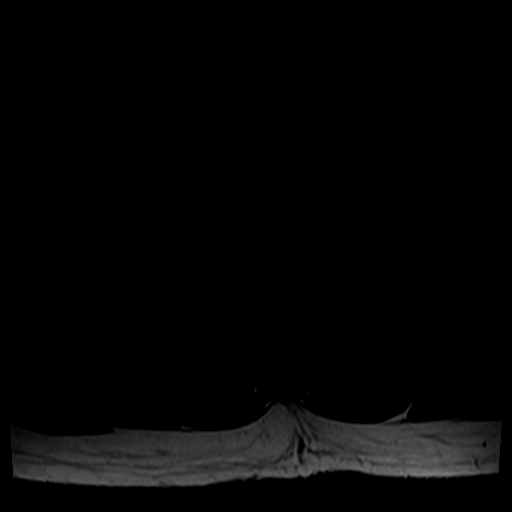

[24 of 48 positions shown; findings below may reference images not displayed]

FINDINGS: Segmentation:  Standard.

Alignment:  Physiologic.

Vertebrae:  No fracture, evidence of discitis, or bone lesion.

Conus medullaris and cauda equina: Conus extends to the T12 level.
Conus and cauda equina appear normal.

Paraspinal and other soft tissues: Negative.

Disc levels:

T12-L1: Mild disc bulge. No stenosis.

L1-L2: Mild disc bulge with superimposed left subarticular
protrusion. Minimal narrowing of the left lateral recess. No central
spinal canal or neural foraminal stenosis.

L2-L3: Left asymmetric disc bulge with endplate spurring. No spinal
canal stenosis. Mild left foraminal narrowing.

L3-L4: Disc space narrowing with diffuse bulge. Bilateral lateral
recess narrowing without central spinal canal stenosis. Moderate
bilateral neural foraminal stenosis.

L4-L5: Disc space narrowing with endplate spurring and disc bulge.
No spinal canal stenosis. Moderate right, mild left facet
hypertrophy. Severe bilateral neural foraminal stenosis.

L5-S1: Mild disc bulge. Moderate bilateral neural foraminal
stenosis.

Visualized sacrum: Normal.
IMPRESSION: Moderate-to-severe neural foraminal stenosis at the L3-S1 levels.

## 2022-07-05 NOTE — H&P (Signed)
HIP ARTHROPLASTY ADMISSION H&P  Patient ID: Eugene Marshall MRN: [id number] DOB/AGE: 67/22/1957 67 y.o.  Chief Complaint: right hip pain.  Planned Procedure Date: 07/18/22 Medical Clearance by Dr. Kristie Cowman   Cardiac Clearance by Dr. Tonia Ghent   HPI: Eugene Marshall is a 66 y.o. 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 The patient has a history of pain and functional disability in the right hip due to arthritis and has failed non-surgical conservative treatments for greater than 12 weeks to include NSAID's and/or analgesics, supervised PT with diminished ADL's post treatment, and activity modification.  Onset of symptoms was gradual, starting 3 years ago with gradually worsening course since that time. The patient noted no past surgery on the right hip.  Patient currently rates pain at 10 out of 10 with activity. Patient has night pain, worsening of pain with activity and weight bearing, and pain that interferes with activities of daily living.  Patient has evidence of subchondral sclerosis, periarticular osteophytes, joint space narrowing, and osteolysis of the femoral head  by imaging studies.  There is no active infection.  Past Medical History:  Diagnosis Date   Arthritis    Cancer [facility])    prostate   Hypertension    Past Surgical History:  Procedure Laterality Date   CHOLECYSTECTOMY     Allergies  Allergen Reactions   Sulfacetamide Hives   Penicillins Other (See Comments)    Stopped breathing    Prior to Admission medications   Medication Sig Start Date End Date Taking? Authorizing Provider  Multiple Vitamins-Minerals (EMERGEN-C VITAMIN C PO) Take 1 tablet by mouth 3 (three) times a week.   Yes [provider]  naproxen sodium (ALEVE) 220 MG tablet Take 440 mg by mouth at bedtime as needed (sleep).   Yes [provider]   Social History   Socioeconomic History   Marital status: Married    Spouse name: Not on file   Number of children: Not on  file   Years of education: Not on file   Highest education level: Not on file  Occupational History   Not on file  Tobacco Use   Smoking status: Never   Smokeless tobacco: Not on file  Substance and Sexual Activity   Alcohol use: Yes   Drug use: Yes    Types: Marijuana   Sexual activity: Not on file  Other Topics Concern   Not on file  Social History Narrative   Not on file   Social Determinants of Health   Financial Resource Strain: Not on file  Food Insecurity: Not on file  Transportation Needs: Not on file  Physical Activity: Not on file  Stress: Not on file  Social Connections: Not on file   No family history on file.  ROS: Currently denies lightheadedness, dizziness, Fever, chills, CP, SOB.   No personal history of DVT, PE, MI, or CVA. No loose teeth or dentures All other systems have been reviewed and were otherwise currently negative with the exception of those mentioned in the HPI and as above.  Objective: Vitals: Ht: 5'10" Wt: 176.6 lbs Temp: 97.5 BP: 142/91 Pulse: 74 O2 96% on room air.   Physical Exam: General: Alert, NAD. Trendelenberg Gait  HEENT: EOMI, Good Neck Extension  Pulm: No increased work of breathing.  Clear B/L A/P w/o crackle or wheeze.  CV: RRR, No m/g/r appreciated  GI: soft, NT, ND. BS x 4 quadrants Neuro: CN II-XII grossly intact without focal deficit.  Sensation intact distally  Skin: No lesions in the area of chief complaint MSK/Surgical Site: non- TTP. Hip pain with ROM. + Stinchfield. - SLR. + FABER/FADIR. 5/5 strength.  NVI.    Imaging Review Plain radiographs demonstrate severe degenerative joint disease of the right hip.   The bone quality appears to be poor for age and reported activity level.  Preoperative templating of the joint replacement has been completed, documented, and submitted to the Operating Room personnel in order to optimize intra-operative equipment management.  Assessment: OA RIGHT HIP Active Problems:   * No  active hospital problems. *   Plan: Plan for Procedure(s): TOTAL HIP ARTHROPLASTY ANTERIOR APPROACH  The patient history, physical exam, clinical judgement of the provider and imaging are consistent with end stage degenerative joint disease and total joint arthroplasty is deemed medically necessary. The treatment options including medical management, injection therapy, and arthroplasty were discussed at length. The risks and benefits of Procedure(s): TOTAL HIP ARTHROPLASTY ANTERIOR APPROACH were presented and reviewed.  The risks of nonoperative treatment, versus surgical intervention including but not limited to continued pain, aseptic loosening, stiffness, dislocation/subluxation, infection, bleeding, nerve injury, blood clots, cardiopulmonary complications, morbidity, mortality, among others were discussed. The patient verbalizes understanding and wishes to proceed with the plan.  Patient is being admitted for surgery, pain control, PT, prophylactic antibiotics, VTE prophylaxis, progressive ambulation, ADL's and discharge planning.   Dental prophylaxis discussed and recommended for 2 years postoperatively.  The patient does meet the criteria for TXA which will be used perioperatively.   ASA 81 mg BID will be used postoperatively for DVT prophylaxis in addition to SCDs, and early ambulation. Plan for Oxycodone, Mobic, Tylenol for pain.   Robaxin for muscle spasm.  Zofran for nausea and vomiting. Colace for constipation prevention Pharmacy- [facility] on [address] The patient is planning to be discharged home with OPPT and into the care of his wife Eugene Marshall who can be reached at [phone] Follow up appt 08/02/22 at 4:15pm     Alisa Graff Office [phone] 07/05/2022 6:33 PM

## 2022-07-05 NOTE — Patient Instructions (Signed)
DUE TO COVID-19 ONLY TWO VISITORS  (aged 67 and older)  ARE ALLOWED TO COME WITH YOU AND STAY IN THE WAITING ROOM ONLY DURING PRE OP AND PROCEDURE.   **NO VISITORS ARE ALLOWED IN THE SHORT STAY AREA OR RECOVERY ROOM!!**  IF YOU WILL BE ADMITTED INTO THE HOSPITAL YOU ARE ALLOWED ONLY FOUR SUPPORT PEOPLE DURING VISITATION HOURS ONLY (7 AM -8PM)   The support person(s) must pass our screening, gel in and out, and wear a mask at all times, including in the patient's room. Patients must also wear a mask when staff or their support person are in the room. Visitors GUEST BADGE MUST BE WORN VISIBLY  One adult visitor may remain with you overnight and MUST be in the room by 8 P.M.     Your procedure is scheduled on: 07/18/22   Report to [facility] Main Entrance    Report to admitting at : 5:30 AM   Call this number if you have problems the morning of surgery [phone]   Do not eat food :After Midnight.   After Midnight you may have the following liquids until : 4:30 AM DAY OF SURGERY  Water Black Coffee (sugar ok, NO MILK/CREAM OR CREAMERS)  Tea (sugar ok, NO MILK/CREAM OR CREAMERS) regular and decaf                             Plain Jell-O (NO RED)                                           Fruit ices (not with fruit pulp, NO RED)                                     Popsicles (NO RED)                                                                  Juice: apple, WHITE grape, WHITE cranberry Sports drinks like Gatorade (NO RED)              Drink Ensure drink AT : 4:30 AM the morning of surgery.     The day of surgery:  Drink ONE (1) Pre-Surgery Clear Ensure or G2 at AM the morning of surgery. Drink in one sitting. Do not sip.  This drink was given to you during your hospital  pre-op appointment visit. Nothing else to drink after completing the  Pre-Surgery Clear Ensure or G2.          If you have questions, please contact your surgeon's office.    Oral Hygiene is also  important to reduce your risk of infection.                                    Remember - BRUSH YOUR TEETH THE MORNING OF SURGERY WITH YOUR REGULAR TOOTHPASTE   Do NOT smoke after Midnight   Take these medicines the morning of surgery with A  SIP OF WATER: N/A  DO NOT TAKE ANY ORAL DIABETIC MEDICATIONS DAY OF YOUR SURGERY  Bring CPAP mask and tubing day of surgery.                              You may not have any metal on your body including hair pins, jewelry, and body piercing             Do not wear lotions, powders, perfumes/cologne, or deodorant              Men may shave face and neck.   Do not bring valuables to the hospital. [place].   Contacts, dentures or bridgework may not be worn into surgery.   Bring small overnight bag day of surgery.   DO NOT [place]. PHARMACY WILL DISPENSE MEDICATIONS LISTED ON YOUR MEDICATION LIST TO YOU DURING YOUR ADMISSION [facility]!    Patients discharged on the day of surgery will not be allowed to drive home.  Someone NEEDS to stay with you for the first 24 hours after anesthesia.   Special Instructions: Bring a copy of your healthcare power of attorney and living will documents         the day of surgery if you haven't scanned them before.              Please read over the following fact sheets you were given: IF YOU HAVE QUESTIONS ABOUT YOUR PRE-OP INSTRUCTIONS PLEASE CALL [phone]     [facility] - Preparing for Surgery Before surgery, you can play an important role.  Because skin is not sterile, your skin needs to be as free of germs as possible.  You can reduce the number of germs on your skin by washing with CHG (chlorahexidine gluconate) soap before surgery.  CHG is an antiseptic cleaner which kills germs and bonds with the skin to continue killing germs even after washing. Please DO NOT use if you have an allergy to CHG or antibacterial  soaps.  If your skin becomes reddened/irritated stop using the CHG and inform your nurse when you arrive at Short Stay. Do not shave (including legs and underarms) for at least 48 hours prior to the first CHG shower.  You may shave your face/neck. Please follow these instructions carefully:  1.  Shower with CHG Soap the night before surgery and the  morning of Surgery.  2.  If you choose to wash your hair, wash your hair first as usual with your  normal  shampoo.  3.  After you shampoo, rinse your hair and body thoroughly to remove the  shampoo.                           4.  Use CHG as you would any other liquid soap.  You can apply chg directly  to the skin and wash                       Gently with a scrungie or clean washcloth.  5.  Apply the CHG Soap to your body ONLY FROM THE NECK DOWN.   Do not use on face/ open  Wound or open sores. Avoid contact with eyes, ears mouth and genitals (private parts).                       Wash face,  Genitals (private parts) with your normal soap.             6.  Wash thoroughly, paying special attention to the area where your surgery  will be performed.  7.  Thoroughly rinse your body with warm water from the neck down.  8.  DO NOT shower/wash with your normal soap after using and rinsing off  the CHG Soap.                9.  Pat yourself dry with a clean towel.            10.  Wear clean pajamas.            11.  Place clean sheets on your bed the night of your first shower and do not  sleep with pets. Day of Surgery : Do not apply any lotions/deodorants the morning of surgery.  Please wear clean clothes to the hospital/surgery center.  FAILURE TO FOLLOW THESE INSTRUCTIONS MAY RESULT IN THE CANCELLATION OF YOUR SURGERY PATIENT SIGNATURE_________________________________  NURSE SIGNATURE__________________________________  ________________________________________________________________________   Adam Phenix  An  incentive spirometer is a tool that can help keep your lungs clear and active. This tool measures how well you are filling your lungs with each breath. Taking long deep breaths may help reverse or decrease the chance of developing breathing (pulmonary) problems (especially infection) following: A long period of time when you are unable to move or be active. BEFORE THE PROCEDURE  If the spirometer includes an indicator to show your best effort, your nurse or respiratory therapist will set it to a desired goal. If possible, sit up straight or lean slightly forward. Try not to slouch. Hold the incentive spirometer in an upright position. INSTRUCTIONS FOR USE  Sit on the edge of your bed if possible, or sit up as far as you can in bed or on a chair. Hold the incentive spirometer in an upright position. Breathe out normally. Place the mouthpiece in your mouth and seal your lips tightly around it. Breathe in slowly and as deeply as possible, raising the piston or the ball toward the top of the column. Hold your breath for 3-5 seconds or for as long as possible. Allow the piston or ball to fall to the bottom of the column. Remove the mouthpiece from your mouth and breathe out normally. Rest for a few seconds and repeat Steps 1 through 7 at least 10 times every 1-2 hours when you are awake. Take your time and take a few normal breaths between deep breaths. The spirometer may include an indicator to show your best effort. Use the indicator as a goal to work toward during each repetition. After each set of 10 deep breaths, practice coughing to be sure your lungs are clear. If you have an incision (the cut made at the time of surgery), support your incision when coughing by placing a pillow or rolled up towels firmly against it. Once you are able to get out of bed, walk around indoors and cough well. You may stop using the incentive spirometer when instructed by your caregiver.  RISKS AND COMPLICATIONS Take  your time so you do not get dizzy or light-headed. If you are in pain, you may need to take or  ask for pain medication before doing incentive spirometry. It is harder to take a deep breath if you are having pain. AFTER USE Rest and breathe slowly and easily. It can be helpful to keep track of a log of your progress. Your caregiver can provide you with a simple table to help with this. If you are using the spirometer at home, follow these instructions: [place] IF:  You are having difficultly using the spirometer. You have trouble using the spirometer as often as instructed. Your pain medication is not giving enough relief while using the spirometer. You develop fever of 100.5 F (38.1 C) or higher. SEEK IMMEDIATE MEDICAL CARE IF:  You cough up bloody sputum that had not been present before. You develop fever of 102 F (38.9 C) or greater. You develop worsening pain at or near the incision site. MAKE SURE YOU:  Understand these instructions. Will watch your condition. Will get help right away if you are not doing well or get worse. Document Released: 04/09/2007 Document Revised: 02/19/2012 Document Reviewed: 06/10/2007 [facility] Patient Information 2014 [place], [place].   ________________________________________________________________________

## 2022-07-06 ENCOUNTER — Encounter (HOSPITAL_COMMUNITY): Payer: Self-pay

## 2022-07-06 ENCOUNTER — Encounter (HOSPITAL_COMMUNITY)
Admission: RE | Admit: 2022-07-06 | Discharge: 2022-07-06 | Disposition: A | Discharge status: Home / Self Care | Payer: Medicare Other | Source: Ambulatory Visit | Attending: Orthopedic Surgery | Admitting: Orthopedic Surgery

## 2022-07-06 ENCOUNTER — Other Ambulatory Visit: Payer: Self-pay

## 2022-07-06 VITALS — BP 142/92 | HR 84 | Temp 98.2°F | Ht 71.0 in | Wt 175.0 lb

## 2022-07-06 DIAGNOSIS — Z01818 Encounter for other preprocedural examination: Secondary | ICD-10-CM | POA: Diagnosis not present

## 2022-07-06 DIAGNOSIS — I2510 Atherosclerotic heart disease of native coronary artery without angina pectoris: Secondary | ICD-10-CM | POA: Insufficient documentation

## 2022-07-06 DIAGNOSIS — Z01812 Encounter for preprocedural laboratory examination: Secondary | ICD-10-CM | POA: Diagnosis present

## 2022-07-06 LAB — CBC
HCT: 47.9 % (ref 39.0–52.0)
Hemoglobin: 16.5 g/dL (ref 13.0–17.0)
MCH: 28.7 pg (ref 26.0–34.0)
MCHC: 34.4 g/dL (ref 30.0–36.0)
MCV: 83.4 fL (ref 80.0–100.0)
Platelets: 113 10*3/uL — ABNORMAL LOW (ref 150–400)
RBC: 5.74 MIL/uL (ref 4.22–5.81)
RDW: 12.2 % (ref 11.5–15.5)
WBC: 5.9 10*3/uL (ref 4.0–10.5)
nRBC: 0 % (ref 0.0–0.2)

## 2022-07-06 LAB — BASIC METABOLIC PANEL
Anion gap: 9 (ref 5–15)
BUN: 10 mg/dL (ref 8–23)
CO2: 25 mmol/L (ref 22–32)
Calcium: 9.6 mg/dL (ref 8.9–10.3)
Chloride: 107 mmol/L (ref 98–111)
Creatinine, Ser: 1 mg/dL (ref 0.61–1.24)
GFR, Estimated: >60 mL/min (ref >60)
Glucose, Bld: 100 mg/dL — ABNORMAL HIGH (ref 70–99)
Potassium: 3.9 mmol/L (ref 3.5–5.1)
Sodium: 141 mmol/L (ref 135–145)

## 2022-07-06 LAB — SURGICAL PCR SCREEN
MRSA, PCR: NEGATIVE
Staphylococcus aureus: NEGATIVE

## 2022-07-06 NOTE — Progress Notes (Signed)
For Short Stay: [place] appointment date: Date of COVID positive in last 73 days:  Bowel Prep reminder:   For Anesthesia: PCP - Dr. Jene Every. Clearance: Dr. Kristie Cowman: 05/26/22: 05/26/22.: Chart. Cardiologist -   Chest x-ray -  EKG -  Stress Test -  ECHO - 03/13/12 Cardiac Cath -  Pacemaker/ICD device last checked: Pacemaker orders received: Device Rep notified:  Spinal Cord Stimulator:  Sleep Study -  CPAP -   Fasting Blood Sugar -  Checks Blood Sugar _____ times a day Date and result of last Hgb A1c-  Blood Thinner Instructions: Aspirin Instructions: Last Dose:  Activity level: Can go up a flight of stairs and activities of daily living without stopping and without chest pain and/or shortness of breath   Able to exercise without chest pain and/or shortness of breath   Unable to go up a flight of stairs without chest pain and/or shortness of breath     Anesthesia review:   Patient denies shortness of breath, fever, cough and chest pain at PAT appointment   Patient verbalized understanding of instructions that were given to them at the PAT appointment. Patient was also instructed that they will need to review over the PAT instructions again at home before surgery.

## 2022-07-11 ENCOUNTER — Telehealth: Payer: Self-pay | Admitting: *Deleted

## 2022-07-11 NOTE — Telephone Encounter (Signed)
-----   Message from Wyatt Portela, MD sent at 07/11/2022  2:43 PM EDT ----- Regarding: RE: Surgical clearance His platelets are adequate for any surgery.  If they need something formal they can fax [place] a clearance to sign. ----- Message ----- From: Rolene Course, RN Sent: 07/11/2022   2:40 PM EDT To: Wyatt Portela, MD Subject: Surgical clearance                             Raliegh Ip called, this patient is scheduled to have a total hip replacement on 07/18/22 & they are asking for surgical clearance due to his hx of thrombocytopenia.  Please advise.  Thanks, Bethena Roys

## 2022-07-11 NOTE — Telephone Encounter (Signed)
Returned PC to [place] with [facility], left VM - informed her of Dr Hazeline Junker message as below.  Instructed her to fax surgical clearance form to this office if needed, fax [phone].

## 2022-07-17 NOTE — Anesthesia Preprocedure Evaluation (Signed)
Anesthesia Evaluation  Patient identified by MRN, date of birth, ID band Patient awake    Reviewed: Allergy & Precautions, NPO status , Patient's Chart, lab work & pertinent test results  Airway Mallampati: I  TM Distance: >3 FB Neck ROM: Full    Dental no notable dental hx. (+) Missing, Dental Advisory Given   Pulmonary neg pulmonary ROS,    Pulmonary exam normal breath sounds clear to auscultation       Cardiovascular hypertension, Normal cardiovascular exam Rhythm:Regular Rate:Normal     Neuro/Psych negative neurological ROS  negative psych ROS   GI/Hepatic negative GI ROS, Neg liver ROS,   Endo/Other  negative endocrine ROS  Renal/GU negative Renal ROS   Prostate Ca    Musculoskeletal  (+) Arthritis , Osteoarthritis,  OA right hip   Abdominal   Peds  Hematology negative hematology ROS (+)   Anesthesia Other Findings   Reproductive/Obstetrics                           Anesthesia Physical Anesthesia Plan  ASA: 2  Anesthesia Plan: Spinal   Post-op Pain Management: Regional block*   Induction: Intravenous  PONV Risk Score and Plan: 2 and Propofol infusion, Treatment may vary due to age or medical condition and Ondansetron  Airway Management Planned: Natural Airway and Simple Face Mask  Additional Equipment: None  Intra-op Plan:   Post-operative Plan:   Informed Consent: I have reviewed the patients History and Physical, chart, labs and discussed the procedure including the risks, benefits and alternatives for the proposed anesthesia with the patient or authorized representative who has indicated his/her understanding and acceptance.     Dental advisory given  Plan Discussed with: CRNA and Anesthesiologist  Anesthesia Plan Comments:       Anesthesia Quick Evaluation

## 2022-07-18 ENCOUNTER — Other Ambulatory Visit: Payer: Self-pay

## 2022-07-18 ENCOUNTER — Encounter (HOSPITAL_COMMUNITY): Payer: Self-pay | Admitting: Orthopedic Surgery

## 2022-07-18 ENCOUNTER — Ambulatory Visit (HOSPITAL_COMMUNITY): Payer: Medicare Other

## 2022-07-18 ENCOUNTER — Ambulatory Visit (HOSPITAL_COMMUNITY): Payer: Medicare Other | Admitting: Certified Registered"

## 2022-07-18 ENCOUNTER — Encounter (HOSPITAL_COMMUNITY)
Admission: RE | Disposition: A | Discharge status: Home / Self Care | Payer: Self-pay | Source: Home / Self Care | Attending: Orthopedic Surgery

## 2022-07-18 ENCOUNTER — Observation Stay (HOSPITAL_COMMUNITY)
Admission: RE | Admit: 2022-07-18 | Discharge: 2022-07-19 | Disposition: A | Discharge status: Home / Self Care | Payer: Medicare Other | Attending: Orthopedic Surgery | Admitting: Orthopedic Surgery

## 2022-07-18 ENCOUNTER — Ambulatory Visit (HOSPITAL_BASED_OUTPATIENT_CLINIC_OR_DEPARTMENT_OTHER): Payer: Medicare Other | Admitting: Certified Registered"

## 2022-07-18 DIAGNOSIS — M1611 Unilateral primary osteoarthritis, right hip: Secondary | ICD-10-CM

## 2022-07-18 DIAGNOSIS — Z79899 Other long term (current) drug therapy: Secondary | ICD-10-CM | POA: Insufficient documentation

## 2022-07-18 DIAGNOSIS — Z8546 Personal history of malignant neoplasm of prostate: Secondary | ICD-10-CM | POA: Diagnosis not present

## 2022-07-18 DIAGNOSIS — I10 Essential (primary) hypertension: Secondary | ICD-10-CM | POA: Diagnosis not present

## 2022-07-18 DIAGNOSIS — Z96641 Presence of right artificial hip joint: Secondary | ICD-10-CM | POA: Diagnosis present

## 2022-07-18 HISTORY — PX: TOTAL HIP ARTHROPLASTY: SHX124

## 2022-07-18 LAB — TYPE AND SCREEN
ABO/RH(D): B POS
Antibody Screen: NEGATIVE

## 2022-07-18 LAB — ABO/RH: ABO/RH(D): B POS

## 2022-07-18 SURGERY — ARTHROPLASTY, HIP, TOTAL, ANTERIOR APPROACH
Anesthesia: Spinal | Site: Hip | Laterality: Right

## 2022-07-18 MED ORDER — ONDANSETRON HCL 4 MG PO TABS: 4.0000 mg | ORAL_TABLET | Freq: Four times a day (QID) | ORAL | Status: DC | PRN

## 2022-07-18 MED ORDER — LIDOCAINE 2% (20 MG/ML) 5 ML SYRINGE
INTRAMUSCULAR | Status: DC | PRN
  Administered 2022-07-18: 40 mg via INTRAVENOUS

## 2022-07-18 MED ORDER — METOCLOPRAMIDE HCL 5 MG/ML IJ SOLN: 5.0000 mg | Freq: Three times a day (TID) | INTRAMUSCULAR | Status: DC | PRN

## 2022-07-18 MED ORDER — DEXAMETHASONE SODIUM PHOSPHATE 10 MG/ML IJ SOLN
INTRAMUSCULAR | Status: AC
Start: 2022-07-18 — End: ?
  Filled 2022-07-18: qty 1

## 2022-07-18 MED ORDER — ONDANSETRON HCL 4 MG/2ML IJ SOLN: 4.0000 mg | Freq: Four times a day (QID) | INTRAMUSCULAR | Status: DC | PRN

## 2022-07-18 MED ORDER — ACETAMINOPHEN 325 MG PO TABS: 325.0000 mg | ORAL_TABLET | Freq: Four times a day (QID) | ORAL | Status: DC | PRN

## 2022-07-18 MED ORDER — ONDANSETRON HCL 4 MG/2ML IJ SOLN
4.0000 mg | Freq: Once | INTRAMUSCULAR | Status: DC | PRN
Start: 2022-07-18 — End: 2022-07-18

## 2022-07-18 MED ORDER — PROPOFOL 500 MG/50ML IV EMUL
INTRAVENOUS | Status: DC | PRN
  Administered 2022-07-18: 40 ug/kg/min via INTRAVENOUS

## 2022-07-18 MED ORDER — OXYCODONE HCL 5 MG PO TABS
5.0000 mg | ORAL_TABLET | ORAL | Status: DC | PRN
  Filled 2022-07-18: qty 2

## 2022-07-18 MED ORDER — MIDAZOLAM HCL 2 MG/2ML IJ SOLN
INTRAMUSCULAR | Status: DC | PRN
  Administered 2022-07-18 (×2): 1 mg via INTRAVENOUS

## 2022-07-18 MED ORDER — FENTANYL CITRATE (PF) 100 MCG/2ML IJ SOLN
INTRAMUSCULAR | Status: AC
  Filled 2022-07-18: qty 2

## 2022-07-18 MED ORDER — CLINDAMYCIN PHOSPHATE 900 MG/50ML IV SOLN
INTRAVENOUS | Status: DC | PRN
  Administered 2022-07-18: 900 mg via INTRAVENOUS

## 2022-07-18 MED ORDER — POLYETHYLENE GLYCOL 3350 17 G PO PACK: 17.0000 g | PACK | Freq: Every day | ORAL | Status: DC | PRN

## 2022-07-18 MED ORDER — VANCOMYCIN HCL IN DEXTROSE 1-5 GM/200ML-% IV SOLN
1000.0000 mg | INTRAVENOUS | Status: AC
  Administered 2022-07-18: 1000 mg via INTRAVENOUS
  Filled 2022-07-18: qty 200

## 2022-07-18 MED ORDER — ONDANSETRON HCL 4 MG/2ML IJ SOLN
INTRAMUSCULAR | Status: AC
  Filled 2022-07-18: qty 2

## 2022-07-18 MED ORDER — BISACODYL 10 MG RE SUPP: 10.0000 mg | Freq: Every day | RECTAL | Status: DC | PRN

## 2022-07-18 MED ORDER — DEXAMETHASONE SODIUM PHOSPHATE 10 MG/ML IJ SOLN
10.0000 mg | Freq: Once | INTRAMUSCULAR | Status: AC
  Administered 2022-07-19: 10 mg via INTRAVENOUS
  Filled 2022-07-18: qty 1

## 2022-07-18 MED ORDER — BUPIVACAINE LIPOSOME 1.3 % IJ SUSP
INTRAMUSCULAR | Status: DC | PRN
  Administered 2022-07-18: 10 mL

## 2022-07-18 MED ORDER — PROPOFOL 10 MG/ML IV BOLUS
INTRAVENOUS | Status: AC
  Filled 2022-07-18: qty 20

## 2022-07-18 MED ORDER — VANCOMYCIN HCL IN DEXTROSE 1-5 GM/200ML-% IV SOLN
1000.0000 mg | Freq: Two times a day (BID) | INTRAVENOUS | Status: AC
  Administered 2022-07-18: 1000 mg via INTRAVENOUS
  Filled 2022-07-18: qty 200

## 2022-07-18 MED ORDER — MIDAZOLAM HCL 2 MG/2ML IJ SOLN
INTRAMUSCULAR | Status: AC
Start: 2022-07-18 — End: ?
  Filled 2022-07-18: qty 2

## 2022-07-18 MED ORDER — OXYCODONE HCL 5 MG/5ML PO SOLN: 5.0000 mg | Freq: Once | ORAL | Status: DC | PRN

## 2022-07-18 MED ORDER — DIPHENHYDRAMINE HCL 12.5 MG/5ML PO ELIX: 12.5000 mg | ORAL_SOLUTION | ORAL | Status: DC | PRN

## 2022-07-18 MED ORDER — CHLORHEXIDINE GLUCONATE 0.12 % MT SOLN
15.0000 mL | Freq: Once | OROMUCOSAL | Status: AC
  Administered 2022-07-18: 15 mL via OROMUCOSAL

## 2022-07-18 MED ORDER — TRAMADOL HCL 50 MG PO TABS
50.0000 mg | ORAL_TABLET | Freq: Four times a day (QID) | ORAL | Status: DC
  Administered 2022-07-18 – 2022-07-19 (×5): 50 mg via ORAL
  Filled 2022-07-18 (×5): qty 1

## 2022-07-18 MED ORDER — GLYCOPYRROLATE 0.2 MG/ML IJ SOLN
INTRAMUSCULAR | Status: AC
  Filled 2022-07-18: qty 1

## 2022-07-18 MED ORDER — METHOCARBAMOL 500 MG IVPB - SIMPLE MED: 500.0000 mg | Freq: Four times a day (QID) | INTRAVENOUS | Status: DC | PRN

## 2022-07-18 MED ORDER — PHENYLEPHRINE HCL-NACL 20-0.9 MG/250ML-% IV SOLN
INTRAVENOUS | Status: DC | PRN
  Administered 2022-07-18: 20 ug/min via INTRAVENOUS

## 2022-07-18 MED ORDER — ACETAMINOPHEN 500 MG PO TABS
1000.0000 mg | ORAL_TABLET | Freq: Once | ORAL | Status: AC
  Administered 2022-07-18: 1000 mg via ORAL
  Filled 2022-07-18: qty 2

## 2022-07-18 MED ORDER — EPHEDRINE SULFATE-NACL 50-0.9 MG/10ML-% IV SOSY
PREFILLED_SYRINGE | INTRAVENOUS | Status: DC | PRN
  Administered 2022-07-18: 10 mg via INTRAVENOUS

## 2022-07-18 MED ORDER — ACETAMINOPHEN 500 MG PO TABS
1000.0000 mg | ORAL_TABLET | Freq: Four times a day (QID) | ORAL | Status: AC
  Administered 2022-07-18 – 2022-07-19 (×4): 1000 mg via ORAL
  Filled 2022-07-18 (×4): qty 2

## 2022-07-18 MED ORDER — LIDOCAINE HCL (PF) 2 % IJ SOLN
INTRAMUSCULAR | Status: AC
  Filled 2022-07-18: qty 5

## 2022-07-18 MED ORDER — SODIUM CHLORIDE FLUSH 0.9 % IV SOLN
INTRAVENOUS | Status: DC | PRN
  Administered 2022-07-18: 10 mL

## 2022-07-18 MED ORDER — BUPIVACAINE LIPOSOME 1.3 % IJ SUSP
INTRAMUSCULAR | Status: AC
Start: 2022-07-18 — End: ?
  Filled 2022-07-18: qty 10

## 2022-07-18 MED ORDER — TRANEXAMIC ACID-NACL 1000-0.7 MG/100ML-% IV SOLN
1000.0000 mg | Freq: Once | INTRAVENOUS | Status: AC
  Administered 2022-07-18: 1000 mg via INTRAVENOUS
  Filled 2022-07-18: qty 100

## 2022-07-18 MED ORDER — OXYCODONE HCL 5 MG PO TABS: 5.0000 mg | ORAL_TABLET | Freq: Once | ORAL | Status: DC | PRN

## 2022-07-18 MED ORDER — GLYCOPYRROLATE 0.2 MG/ML IJ SOLN
INTRAMUSCULAR | Status: DC | PRN
  Administered 2022-07-18 (×2): .1 mg via INTRAVENOUS

## 2022-07-18 MED ORDER — ORAL CARE MOUTH RINSE: 15.0000 mL | Freq: Once | OROMUCOSAL | Status: AC

## 2022-07-18 MED ORDER — ONDANSETRON HCL 4 MG/2ML IJ SOLN
INTRAMUSCULAR | Status: DC | PRN
  Administered 2022-07-18: 4 mg via INTRAVENOUS

## 2022-07-18 MED ORDER — METHOCARBAMOL 500 MG PO TABS
500.0000 mg | ORAL_TABLET | Freq: Four times a day (QID) | ORAL | Status: DC | PRN
  Administered 2022-07-18 – 2022-07-19 (×2): 500 mg via ORAL
  Filled 2022-07-18 (×2): qty 1

## 2022-07-18 MED ORDER — FENTANYL CITRATE (PF) 100 MCG/2ML IJ SOLN
INTRAMUSCULAR | Status: DC | PRN
  Administered 2022-07-18: 50 ug via INTRAVENOUS

## 2022-07-18 MED ORDER — SODIUM CHLORIDE (PF) 0.9 % IJ SOLN
INTRAMUSCULAR | Status: AC
  Filled 2022-07-18: qty 10

## 2022-07-18 MED ORDER — PHENOL 1.4 % MT LIQD: 1.0000 | OROMUCOSAL | Status: DC | PRN

## 2022-07-18 MED ORDER — HYDROMORPHONE HCL 1 MG/ML IJ SOLN: 0.2500 mg | INTRAMUSCULAR | Status: DC | PRN

## 2022-07-18 MED ORDER — MENTHOL 3 MG MT LOZG
1.0000 | LOZENGE | OROMUCOSAL | Status: DC | PRN
Start: 2022-07-18 — End: 2022-07-19

## 2022-07-18 MED ORDER — HYDROMORPHONE HCL 1 MG/ML IJ SOLN
0.5000 mg | INTRAMUSCULAR | Status: DC | PRN
  Administered 2022-07-18 (×2): 1 mg via INTRAVENOUS
  Filled 2022-07-18 (×2): qty 1

## 2022-07-18 MED ORDER — DOCUSATE SODIUM 100 MG PO CAPS
100.0000 mg | ORAL_CAPSULE | Freq: Two times a day (BID) | ORAL | Status: DC
  Administered 2022-07-18 – 2022-07-19 (×2): 100 mg via ORAL
  Filled 2022-07-18 (×2): qty 1

## 2022-07-18 MED ORDER — ASPIRIN 81 MG PO CHEW
81.0000 mg | CHEWABLE_TABLET | Freq: Two times a day (BID) | ORAL | Status: DC
  Administered 2022-07-18 – 2022-07-19 (×2): 81 mg via ORAL
  Filled 2022-07-18 (×2): qty 1

## 2022-07-18 MED ORDER — LACTATED RINGERS IV SOLN: INTRAVENOUS | Status: DC

## 2022-07-18 MED ORDER — EPHEDRINE 5 MG/ML INJ
INTRAVENOUS | Status: AC
Start: 2022-07-18 — End: ?
  Filled 2022-07-18: qty 5

## 2022-07-18 MED ORDER — METOCLOPRAMIDE HCL 5 MG PO TABS: 5.0000 mg | ORAL_TABLET | Freq: Three times a day (TID) | ORAL | Status: DC | PRN

## 2022-07-18 MED ORDER — CLINDAMYCIN PHOSPHATE 900 MG/50ML IV SOLN
INTRAVENOUS | Status: AC
  Filled 2022-07-18: qty 50

## 2022-07-18 MED ORDER — POVIDONE-IODINE 10 % EX SWAB
2.0000 | Freq: Once | CUTANEOUS | Status: AC
Start: 2022-07-18 — End: 2022-07-18
  Administered 2022-07-18: 2 via TOPICAL

## 2022-07-18 MED ORDER — POVIDONE-IODINE 10 % EX SWAB: 2.0000 | Freq: Once | CUTANEOUS | Status: DC

## 2022-07-18 MED ORDER — WATER FOR IRRIGATION, STERILE IR SOLN
Status: DC | PRN
  Administered 2022-07-18: 2000 mL

## 2022-07-18 MED ORDER — DEXAMETHASONE SODIUM PHOSPHATE 10 MG/ML IJ SOLN
8.0000 mg | Freq: Once | INTRAMUSCULAR | Status: AC
  Administered 2022-07-18: 8 mg via INTRAVENOUS

## 2022-07-18 MED ORDER — BUPIVACAINE IN DEXTROSE 0.75-8.25 % IT SOLN
INTRATHECAL | Status: DC | PRN
  Administered 2022-07-18: 1.8 mL via INTRATHECAL

## 2022-07-18 MED ORDER — BUPIVACAINE LIPOSOME 1.3 % IJ SUSP: 10.0000 mL | Freq: Once | INTRAMUSCULAR | Status: DC

## 2022-07-18 MED ORDER — 0.9 % SODIUM CHLORIDE (POUR BTL) OPTIME
TOPICAL | Status: DC | PRN
  Administered 2022-07-18: 1000 mL

## 2022-07-18 MED ORDER — PANTOPRAZOLE SODIUM 40 MG PO TBEC
40.0000 mg | DELAYED_RELEASE_TABLET | Freq: Every day | ORAL | Status: DC
  Administered 2022-07-18 – 2022-07-19 (×2): 40 mg via ORAL
  Filled 2022-07-18 (×2): qty 1

## 2022-07-18 MED ORDER — ALUM & MAG HYDROXIDE-SIMETH 200-200-20 MG/5ML PO SUSP: 30.0000 mL | ORAL | Status: DC | PRN

## 2022-07-18 MED ORDER — PROPOFOL 1000 MG/100ML IV EMUL
INTRAVENOUS | Status: AC
  Filled 2022-07-18: qty 100

## 2022-07-18 MED ORDER — OXYCODONE HCL 5 MG PO TABS
10.0000 mg | ORAL_TABLET | ORAL | Status: DC | PRN
  Administered 2022-07-18: 10 mg via ORAL
  Filled 2022-07-18: qty 3

## 2022-07-18 MED ORDER — TRANEXAMIC ACID-NACL 1000-0.7 MG/100ML-% IV SOLN
1000.0000 mg | INTRAVENOUS | Status: AC
  Administered 2022-07-18: 1000 mg via INTRAVENOUS
  Filled 2022-07-18: qty 100

## 2022-07-18 SURGICAL SUPPLY — 44 items
BAG COUNTER SPONGE SURGICOUNT (BAG) IMPLANT
BAG ZIPLOCK 12X15 (MISCELLANEOUS) IMPLANT
BLADE SAG 18X100X1.27 (BLADE) ×2 IMPLANT
BLADE SURG SZ10 CARB STEEL (BLADE) ×2 IMPLANT
CHLORAPREP W/TINT 26 (MISCELLANEOUS) ×2 IMPLANT
CLSR STERI-STRIP ANTIMIC 1/2X4 (GAUZE/BANDAGES/DRESSINGS) ×2 IMPLANT
COVER PERINEAL POST (MISCELLANEOUS) ×2 IMPLANT
COVER SURGICAL LIGHT HANDLE (MISCELLANEOUS) ×2 IMPLANT
DRAPE IMP U-DRAPE 54X76 (DRAPES) ×2 IMPLANT
DRAPE STERI IOBAN 125X83 (DRAPES) ×2 IMPLANT
DRAPE U-SHAPE 47X51 STRL (DRAPES) ×4 IMPLANT
DRSG MEPILEX BORDER 4X8 (GAUZE/BANDAGES/DRESSINGS) ×2 IMPLANT
ELECT REM PT RETURN 15FT ADLT (MISCELLANEOUS) ×2 IMPLANT
GLOVE BIO SURGEON STRL SZ7.5 (GLOVE) ×2 IMPLANT
GLOVE BIOGEL PI IND STRL 7.5 (GLOVE) ×1 IMPLANT
GLOVE BIOGEL PI IND STRL 8 (GLOVE) ×1 IMPLANT
GLOVE BIOGEL PI INDICATOR 7.5 (GLOVE) ×1
GLOVE BIOGEL PI INDICATOR 8 (GLOVE) ×1
GLOVE SURG SYN 7.5  E (GLOVE) ×2
GLOVE SURG SYN 7.5 E (GLOVE) ×1 IMPLANT
GLOVE SURG SYN 7.5 PF PI (GLOVE) ×1 IMPLANT
GOWN SPEC L4 XLG W/TWL (GOWN DISPOSABLE) ×2 IMPLANT
GOWN STRL REUS W/ TWL LRG LVL3 (GOWN DISPOSABLE) ×1 IMPLANT
GOWN STRL REUS W/TWL LRG LVL3 (GOWN DISPOSABLE) ×1
HEAD CERAMIC FEMORAL 36MM (Head) ×1 IMPLANT
HOLDER FOLEY CATH W/STRAP (MISCELLANEOUS) IMPLANT
INSERT TRIDENT POLY 36 0DEG (Insert) ×1 IMPLANT
KIT TURNOVER KIT A (KITS) IMPLANT
MANIFOLD NEPTUNE II (INSTRUMENTS) ×2 IMPLANT
NS IRRIG 1000ML POUR BTL (IV SOLUTION) ×2 IMPLANT
PACK ANTERIOR HIP CUSTOM (KITS) ×2 IMPLANT
PROTECTOR NERVE ULNAR (MISCELLANEOUS) ×2 IMPLANT
SCREW HEX LP 6.5X20 (Screw) ×1 IMPLANT
SHELL ACETABUL CLUSTER SZ 54 (Shell) ×1 IMPLANT
SPIKE FLUID TRANSFER (MISCELLANEOUS) ×4 IMPLANT
STEM HIP 127 DEG (Stem) ×1 IMPLANT
SUT MNCRL AB 3-0 PS2 18 (SUTURE) ×2 IMPLANT
SUT VIC AB 0 CT1 36 (SUTURE) ×2 IMPLANT
SUT VIC AB 1 CT1 36 (SUTURE) ×2 IMPLANT
SUT VIC AB 2-0 CT1 27 (SUTURE) ×4
SUT VIC AB 2-0 CT1 TAPERPNT 27 (SUTURE) ×2 IMPLANT
TRAY FOLEY MTR SLVR 16FR STAT (SET/KITS/TRAYS/PACK) IMPLANT
TUBE SUCTION HIGH CAP CLEAR NV (SUCTIONS) ×2 IMPLANT
WATER STERILE IRR 1000ML POUR (IV SOLUTION) ×4 IMPLANT

## 2022-07-18 NOTE — Plan of Care (Signed)
  Problem: Education: Goal: Knowledge of General Education information will improve Description: Including pain rating scale, medication(s)/side effects and non-pharmacologic comfort measures Outcome: Progressing   Problem: Nutrition: Goal: Adequate nutrition will be maintained Outcome: Adequate for Discharge   Problem: Elimination: Goal: Will not experience complications related to urinary retention Outcome: Adequate for Discharge   Problem: Pain Managment: Goal: General experience of comfort will improve Outcome: Progressing

## 2022-07-18 NOTE — Anesthesia Procedure Notes (Signed)
Procedure Name: MAC Date/Time: 07/18/2022 7:25 AM  Performed by: Eben Burow, CRNAPre-anesthesia Checklist: Patient identified, Emergency Drugs available, Suction available, Patient being monitored and Timeout performed Oxygen Delivery Method: Simple face mask Placement Confirmation: positive ETCO2

## 2022-07-18 NOTE — Transfer of Care (Signed)
Immediate Anesthesia Transfer of Care Note  Patient: Eugene Marshall  Procedure(s) Performed: TOTAL HIP ARTHROPLASTY ANTERIOR APPROACH (Right: Hip)  Patient Location: PACU  Anesthesia Type:MAC and Spinal  Level of Consciousness: awake, drowsy and patient cooperative  Airway & Oxygen Therapy: Patient Spontanous Breathing and Patient connected to face mask oxygen  Post-op Assessment: Report given to RN and Post -op Vital signs reviewed and stable  Post vital signs: Reviewed and stable  Last Vitals:  Vitals Value Taken Time  BP 133/77 07/18/22 0945  Temp    Pulse 62 07/18/22 0947  Resp 13 07/18/22 0947  SpO2 100 % 07/18/22 0947  Vitals shown include unvalidated device data.  Last Pain:  Vitals:   07/18/22 0627  TempSrc:   PainSc: 0-No pain      Patients Stated Pain Goal: 4 (48/01/65 5374)  Complications: No notable events documented.

## 2022-07-18 NOTE — Anesthesia Procedure Notes (Signed)
Spinal  Patient location during procedure: OR Start time: 07/18/2022 7:28 AM End time: 07/18/2022 7:31 AM Reason for block: surgical anesthesia Staffing Performed: anesthesiologist  Anesthesiologist: Josephine Igo, MD Performed by: Josephine Igo, MD Authorized by: Josephine Igo, MD   Preanesthetic Checklist Completed: patient identified, IV checked, site marked, risks and benefits discussed, surgical consent, monitors and equipment checked, pre-op evaluation and timeout performed Spinal Block Patient position: sitting Prep: DuraPrep and site prepped and draped Patient monitoring: heart rate, cardiac monitor, continuous pulse ox and blood pressure Approach: midline Location: L3-4 Injection technique: single-shot Needle Needle type: Pencan  Needle gauge: 24 G Needle length: 9 cm Needle insertion depth: 7 cm Assessment Sensory level: T4 Events: CSF return Additional Notes Patient tolerated procedure well. Adequate sensory level.

## 2022-07-18 NOTE — Anesthesia Postprocedure Evaluation (Addendum)
Anesthesia Post Note  Patient: Inocencio Roy  Procedure(s) Performed: TOTAL HIP ARTHROPLASTY ANTERIOR APPROACH (Right: Hip)     Patient location during evaluation: PACU Anesthesia Type: Spinal Level of consciousness: oriented and awake and alert Pain management: pain level controlled Vital Signs Assessment: post-procedure vital signs reviewed and stable Respiratory status: spontaneous breathing, respiratory function stable and nonlabored ventilation Cardiovascular status: blood pressure returned to baseline and stable Postop Assessment: no headache, no backache, no apparent nausea or vomiting, spinal receding and patient able to bend at knees Anesthetic complications: no   No notable events documented.  Last Vitals:  Vitals:   07/18/22 1030 07/18/22 1045  BP: 136/88 (!) 136/92  Pulse: 64 (!) 50  Resp: 18 13  Temp:  36.4 C  SpO2: 98% 98%    Last Pain:  Vitals:   07/18/22 1045  TempSrc:   PainSc: 0-No pain    LLE Motor Response: Purposeful movement (07/18/22 1045)   RLE Motor Response: Purposeful movement (07/18/22 1045)   L Sensory Level: L5-Outer lower leg, top of foot, great toe (07/18/22 1045) R Sensory Level: L3-Anterior knee, lower leg (07/18/22 1045)  Litzi Binning,Demetrius A.

## 2022-07-18 NOTE — Evaluation (Signed)
Physical Therapy Evaluation Patient Details Name: Eugene Marshall MRN: [id number] DOB: February 23, 1956 Today's Date: 07/18/2022  History of Present Illness  Pt is a 66yo male presenting s/p R-THA, AA on 07/18/22. PMH: hx of prostate cancer s/p prostatectomy, HTN  Clinical Impression  Eugene Marshall is a 66 y.o. male POD 0 s/p R-THA, AA. Patient reports independence with mobility at baseline. Patient is now limited by functional impairments (see PT problem list below) and requires supervision for bed mobility and min guard for transfers. Patient was able to ambulate 50 feet with RW and min guard level of assist; at end of ambulation task pt found to be incontinent of urine; assisted pt with pericare and changing of gown, RN notified. Patient instructed in exercise to facilitate ROM and circulation to manage edema. Provided incentive spirometer and with Vcs pt able to achieve 2523m. Patient will benefit from continued skilled PT interventions to address impairments and progress towards PLOF. Acute PT will follow to progress mobility and stair training in preparation for safe discharge home.       Recommendations for follow up therapy are one component of a multi-disciplinary discharge planning process, led by the attending physician.  Recommendations may be updated based on patient status, additional functional criteria and insurance authorization.  Follow Up Recommendations Follow physician's recommendations for discharge plan and follow up therapies      Assistance Recommended at Discharge Set up Supervision/Assistance  Patient can return home with the following  A little help with walking and/or transfers;A little help with bathing/dressing/bathroom;Assistance with cooking/housework;Assist for transportation;Help with stairs or ramp for entrance    Equipment Recommendations Rolling walker (2 wheels)  Recommendations for Other Services       Functional Status Assessment Patient has had a recent decline  in their functional status and demonstrates the ability to make significant improvements in function in a reasonable and predictable amount of time.     Precautions / Restrictions Precautions Precautions: Fall Restrictions Weight Bearing Restrictions: Yes RLE Weight Bearing: Weight bearing as tolerated      Mobility  Bed Mobility Overal bed mobility: Needs Assistance Bed Mobility: Supine to Sit     Supine to sit: Supervision     General bed mobility comments: For safety only, no physical assist required.    Transfers Overall transfer level: Needs assistance Equipment used: Rolling walker (2 wheels) Transfers: Sit to/from Stand Sit to Stand: Min guard, From elevated surface           General transfer comment: For safety only, verbal cues for powering up through LLE and BUE    Ambulation/Gait Ambulation/Gait assistance: Min guard Gait Distance (Feet): 50 Feet Assistive device: Rolling walker (2 wheels) Gait Pattern/deviations: Step-to pattern Gait velocity: decreased     General Gait Details: Pt ambulated with RW and min guard, no phyiscal assist required or overt LOB noted. Upon returning to room, pt realized he was incontinent of urine, helped pt to clean up and provided min assist for steadying in stance while pt completed pericare, changed gown and linens, pt in recliner upon exit, RN notified.  Stairs            Wheelchair Mobility    Modified Rankin (Stroke Patients Only)       Balance Overall balance assessment: Needs assistance Sitting-balance support: No upper extremity supported, Feet supported Sitting balance-Leahy Scale: Good     Standing balance support: Single extremity supported, Reliant on assistive device for balance, During functional activity, Bilateral upper extremity supported Standing balance-Leahy  Scale: Fair Standing balance comment: Pt able to complete stance with single UE support, dynamic activities required BUE support on  RW                             Pertinent Vitals/Pain Pain Assessment Pain Assessment: 0-10 Pain Score: 5  Pain Location: right hip Pain Descriptors / Indicators: Operative site guarding Pain Intervention(s): Limited activity within patient's tolerance, Monitored during session, Repositioned, Ice applied    Home Living Family/patient expects to be discharged to:: Private residence Living Arrangements: Spouse/significant other;Children Available Help at Discharge: Family;Available 24 hours/day Type of Home: House Home Access: Stairs to enter Entrance Stairs-Rails: None Entrance Stairs-Number of Steps: 3   Home Layout: Able to live on main level with bedroom/bathroom Home Equipment: None      Prior Function Prior Level of Function : Independent/Modified Independent;Driving             Mobility Comments: ind ADLs Comments: ind     Hand Dominance        Extremity/Trunk Assessment   Upper Extremity Assessment Upper Extremity Assessment: Overall WFL for tasks assessed    Lower Extremity Assessment Lower Extremity Assessment: LLE deficits/detail;RLE deficits/detail RLE Deficits / Details: MMT ank DF/PF 5/5 RLE Sensation: WNL LLE Deficits / Details: MMT ank DF/PF 5/5 LLE Sensation: WNL    Cervical / Trunk Assessment Cervical / Trunk Assessment: Normal  Communication   Communication: No difficulties  Cognition Arousal/Alertness: Awake/alert Behavior During Therapy: WFL for tasks assessed/performed Overall Cognitive Status: Within Functional Limits for tasks assessed                                          General Comments General comments (skin integrity, edema, etc.): Wife and daughter present    Exercises Total Joint Exercises Ankle Circles/Pumps: AROM, Both, 10 reps, Seated   Assessment/Plan    PT Assessment Patient needs continued PT services  PT Problem List Decreased strength;Decreased range of motion;Decreased  activity tolerance;Decreased balance;Decreased mobility;Decreased coordination;Pain       PT Treatment Interventions DME instruction;Gait training;Stair training;Functional mobility training;Therapeutic activities;Therapeutic exercise;Balance training;Neuromuscular re-education;Patient/family education    PT Goals (Current goals can be found in the Care Plan section)  Acute Rehab PT Goals Patient Stated Goal: Walk without pain PT Goal Formulation: With patient Time For Goal Achievement: 07/25/22 Potential to Achieve Goals: Good    Frequency 7X/week     Co-evaluation               AM-PAC PT "6 Clicks" Mobility  Outcome Measure Help needed turning from your back to your side while in a flat bed without using bedrails?: None Help needed moving from lying on your back to sitting on the side of a flat bed without using bedrails?: None Help needed moving to and from a bed to a chair (including a wheelchair)?: A Little Help needed standing up from a chair using your arms (e.g., wheelchair or bedside chair)?: A Little Help needed to walk in hospital room?: A Little Help needed climbing 3-5 steps with a railing? : A Little 6 Click Score: 20    End of Session Equipment Utilized During Treatment: Gait belt Activity Tolerance: Patient tolerated treatment well;No increased pain Patient left: in chair;with call bell/phone within reach;with chair alarm set;with family/visitor present Nurse Communication: Mobility status PT Visit Diagnosis: Pain;Difficulty in  walking, not elsewhere classified (R26.2) Pain - Right/Left: Right Pain - part of body: Hip    Time: 0164-2903 PT Time Calculation (min) (ACUTE ONLY): 31 min   Charges:   PT Evaluation $PT Eval Low Complexity: 1 Low PT Treatments $Gait Training: 8-22 mins        Coolidge Breeze, PT, DPT [facility] Office: [phone] Pager: [phone]  Coolidge Breeze 07/18/2022, 3:06 PM

## 2022-07-18 NOTE — Discharge Instructions (Signed)

## 2022-07-18 NOTE — Interval H&P Note (Signed)
History and Physical Interval Note:  07/18/2022 6:59 AM  [name]  has presented today for surgery, with the diagnosis of OA RIGHT HIP.  The various methods of treatment have been discussed with the patient and family. After consideration of risks, benefits and other options for treatment, the patient has consented to  Procedure(s): TOTAL HIP ARTHROPLASTY ANTERIOR APPROACH (Right) as a surgical intervention.  The patient's history has been reviewed, patient examined, no change in status, stable for surgery.  I have reviewed the patient's chart and labs.  Questions were answered to the patient's satisfaction.     Eugene Marshall

## 2022-07-18 NOTE — Plan of Care (Signed)
  Problem: Safety: Goal: Ability to remain free from injury will improve Outcome: Progressing   Problem: Skin Integrity: Goal: Risk for impaired skin integrity will decrease Outcome: Progressing   Problem: Education: Goal: Knowledge of the prescribed therapeutic regimen will improve Outcome: Progressing   Problem: Activity: Goal: Ability to avoid complications of mobility impairment will improve Outcome: Progressing   Problem: Clinical Measurements: Goal: Postoperative complications will be avoided or minimized Outcome: Progressing

## 2022-07-18 NOTE — Op Note (Signed)
07/18/2022  9:12 AM  PATIENT:  Eugene Marshall   MRN: [id number]  PRE-OPERATIVE DIAGNOSIS:  OA RIGHT HIP  POST-OPERATIVE DIAGNOSIS:  OA RIGHT HIP  PROCEDURE:  Procedure(s): TOTAL HIP ARTHROPLASTY ANTERIOR APPROACH  PREOPERATIVE INDICATIONS:    Eugene Marshall is an 66 y.o. male who has a diagnosis of <principal problem not specified> and elected for surgical management after failing conservative treatment.  The risks benefits and alternatives were discussed with the patient including but not limited to the risks of nonoperative treatment, versus surgical intervention including infection, bleeding, nerve injury, periprosthetic fracture, the need for revision surgery, dislocation, leg length discrepancy, blood clots, cardiopulmonary complications, morbidity, mortality, among others, and they were willing to proceed.     OPERATIVE REPORT     SURGEON:   Renette Butters, MD    ASSISTANT:  Aggie Moats, PA-C, he was present and scrubbed throughout the case, critical for completion in a timely fashion, and for retraction, instrumentation, and closure.     ANESTHESIA:  General    COMPLICATIONS:  None.     COMPONENTS:  [employer] acolade fit femur size 5 with a 36 mm -0 head ball and an acetabular shell size 54 with a  polyethylene liner    PROCEDURE IN DETAIL:   The patient was met in the holding area and  identified.  The appropriate hip was identified and marked at the operative site.  The patient was then transported to the OR  and  placed under anesthesia per that record.  At that point, the patient was  placed in the supine position and  secured to the operating room table and all bony prominences padded. He received pre-operative antibiotics    The operative lower extremity was prepped from the iliac crest to the distal leg.  Sterile draping was performed.  Time out was performed prior to incision.      Skin incision was made just 2 cm lateral to the ASIS  extending in line with the tensor  fascia lata. Electrocautery was used to control all bleeders. I dissected down sharply to the fascia of the tensor fascia lata was confirmed that the muscle fibers beneath were running posteriorly. I then incised the fascia over the superficial tensor fascia lata in line with the incision. The fascia was elevated off the anterior aspect of the muscle the muscle was retracted posteriorly and protected throughout the case. I then used electrocautery to incise the tensor fascia lata fascia control and all bleeders. Immediately visible was the fat over top of the anterior neck and capsule.  I removed the anterior fat from the capsule and elevated the rectus muscle off of the anterior capsule. I then removed a large time of capsule. The retractors were then placed over the anterior acetabulum as well as around the superior and inferior neck.  I then made a femoral neck cut. Then used the power corkscrew to remove the femoral head from the acetabulum and thoroughly irrigated the acetabulum. I sized the femoral head.    I then exposed the deep acetabulum, cleared out any tissue including the ligamentum teres.   After adequate visualization, I excised the labrum, and then sequentially reamed.  I then impacted the acetabular implant into place using fluoroscopy for guidance.  Appropriate version and inclination was confirmed clinically matching their bony anatomy, and with fluoroscopy.  I placed a 20 mm screw in the posterior/superio position with an excellent bite.    I then placed the polyethylene liner in place  I then adducted the leg and released the external rotators from the posterior femur allowing it to be easily delivered up lateral and anterior to the acetabulum for preparation of the femoral canal.    I then prepared the proximal femur using the cookie-cutter and then sequentially reamed and broached.  A trial broach, neck, and head was utilized, and I reduced the hip and used floroscopy to assess  the neck length and femoral implant.  I then impacted the femoral prosthesis into place into the appropriate version. The hip was then reduced and fluoroscopy confirmed appropriate position. Leg lengths were restored.  I then irrigated the hip copiously again with, and repaired the fascia with Vicryl, followed by monocryl for the subcutaneous tissue, Monocryl for the skin, Steri-Strips and sterile gauze. The patient was then awakened and returned to PACU in stable and satisfactory condition. There were no complications.  POST OPERATIVE PLAN: WBAT, DVT px: SCD's/TED, ambulation and chemical dvt px  Edmonia Lynch, MD Orthopedic Surgeon [phone]

## 2022-07-19 ENCOUNTER — Encounter (HOSPITAL_COMMUNITY): Payer: Self-pay | Admitting: Orthopedic Surgery

## 2022-07-19 DIAGNOSIS — M1611 Unilateral primary osteoarthritis, right hip: Secondary | ICD-10-CM | POA: Diagnosis not present

## 2022-07-19 MED ORDER — ASPIRIN 81 MG PO TBEC: 81.0000 mg | DELAYED_RELEASE_TABLET | Freq: Two times a day (BID) | ORAL | 0 refills | Status: AC

## 2022-07-19 MED ORDER — ACETAMINOPHEN 500 MG PO TABS: 1000.0000 mg | ORAL_TABLET | Freq: Four times a day (QID) | ORAL | 0 refills | Status: DC | PRN

## 2022-07-19 MED ORDER — MELOXICAM 15 MG PO TABS: 15.0000 mg | ORAL_TABLET | Freq: Every day | ORAL | 0 refills | Status: DC | PRN

## 2022-07-19 MED ORDER — OXYCODONE HCL 5 MG PO TABS: 5.0000 mg | ORAL_TABLET | Freq: Four times a day (QID) | ORAL | 0 refills | Status: DC | PRN

## 2022-07-19 MED ORDER — ONDANSETRON 4 MG PO TBDP: 4.0000 mg | ORAL_TABLET | Freq: Two times a day (BID) | ORAL | 0 refills | Status: DC | PRN

## 2022-07-19 MED ORDER — METHOCARBAMOL 750 MG PO TABS: 750.0000 mg | ORAL_TABLET | Freq: Three times a day (TID) | ORAL | 0 refills | Status: DC | PRN

## 2022-07-19 NOTE — Progress Notes (Signed)
    Subjective: Patient reports pain as mild. Tolerating diet. Urinating. No CP, SOB.  Mobilizing OOB well with PT/OT.  Objective:   VITALS:   Vitals:   07/18/22 2038 07/19/22 0152 07/19/22 0518 07/19/22 0957  BP: (!) 147/87 134/76 (!) 151/98 (!) 156/87  Pulse: 60 60 (!) 59 74  Resp: '18 17 18 17  '$ Temp: 98.5 F (36.9 C) 97.8 F (36.6 C) 97.7 F (36.5 C) 97.6 F (36.4 C)  TempSrc: Oral Oral Oral Oral  SpO2: 97% 97% 98% 98%  Weight:      Height:          Latest Ref Rng & Units 07/06/2022    9:45 AM 01/05/2019    2:11 AM 08/20/2010    8:31 AM  CBC  WBC 4.0 - 10.5 K/uL 5.9  6.8    Hemoglobin 13.0 - 17.0 g/dL 16.5  15.7  17.0   Hematocrit 39.0 - 52.0 % 47.9  47.5  50.0   Platelets 150 - 400 K/uL 113  139        Latest Ref Rng & Units 07/06/2022    9:45 AM 01/05/2019    2:11 AM 08/20/2010    8:31 AM  BMP  Glucose 70 - 99 mg/dL 100  92  94   BUN 8 - 23 mg/dL '10  13  7   '$ Creatinine 0.61 - 1.24 mg/dL 1.00  1.08  1.3   Sodium 135 - 145 mmol/L 141  140  141   Potassium 3.5 - 5.1 mmol/L 3.9  3.7  4.2   Chloride 98 - 111 mmol/L 107  104  105   CO2 22 - 32 mmol/L 25  28    Calcium 8.9 - 10.3 mg/dL 9.6  9.1     Intake/Output      08/08 0701 08/09 0700 08/09 0701 08/10 0700   P.O. 480 240   I.V. (mL/kg) 1313.6 (16.5)    IV Piggyback 450 0   Total Intake(mL/kg) 2243.6 (28.3) 240 (3)   Urine (mL/kg/hr) 570 (0.3) 600 (1.5)   Blood 250    Total Output 820 600   Net +1423.6 -360        Urine Occurrence 1 x       Physical Exam: General: NAD.  Sitting up in bedside chair, calm, comfortable Resp: No increased wob Cardio: regular rate and rhythm ABD soft Neurologically intact MSK Neurovascularly intact Sensation intact distally Intact pulses distally Dorsiflexion/Plantar flexion intact Incision: dressing C/D/I   Assessment: 1 Day Post-Op  S/P Procedure(s) (LRB): TOTAL HIP ARTHROPLASTY ANTERIOR APPROACH (Right) by Dr. Ernesta Amble. Percell Miller on 07/19/22  Principal  Problem:   S/P total right hip arthroplasty   Plan:  Advance diet Up with therapy Incentive Spirometry Elevate and Apply ice  Weightbearing: WBAT RLE Insicional and dressing care: Dressings left intact until follow-up and Reinforce dressings as needed Orthopedic device(s): None Showering: Keep dressing dry VTE prophylaxis: Aspirin '81mg'$  BID  x 30 days post-op , SCDs, ambulation Pain control: Tylenol, Tramadol, Oxy, Dilaudid Follow - up plan: 2 weeks Contact information:  Edmonia Lynch MD, Aggie Moats PA-C  Dispo: Home later today once passes PT     Britt Bottom, [place] Office [phone] 07/19/2022, 12:00 PM

## 2022-07-19 NOTE — Progress Notes (Signed)
Physical Therapy Treatment Patient Details Name: Eugene Marshall MRN: [id number] DOB: 08-Apr-1956 Today's Date: 07/19/2022   History of Present Illness Pt is a 66yo male presenting s/p R-THA, AA on 07/18/22. PMH: hx of prostate cancer s/p prostatectomy, HTN    PT Comments    Pt seen for first of two sessions POD1, motivated to participate in therapy. Pt required supervision for all mobility tasks today including ambulation in hallway with RW 181f. Provided HEP and pt completed all exercises safely with minimal cuing. Pt reporting higher pain after ambulation and exercises so deferred stair training to second session. Pt is progressing well and barring any changes anticipate he will meet mobility goals later today. We will continue to follow acutely.   Recommendations for follow up therapy are one component of a multi-disciplinary discharge planning process, led by the attending physician.  Recommendations may be updated based on patient status, additional functional criteria and insurance authorization.  Follow Up Recommendations  Follow physician's recommendations for discharge plan and follow up therapies     Assistance Recommended at Discharge Set up Supervision/Assistance  Patient can return home with the following A little help with walking and/or transfers;A little help with bathing/dressing/bathroom;Assistance with cooking/housework;Assist for transportation;Help with stairs or ramp for entrance   Equipment Recommendations  Rolling walker (2 wheels)    Recommendations for Other Services       Precautions / Restrictions Precautions Precautions: Fall Restrictions Weight Bearing Restrictions: Yes RLE Weight Bearing: Weight bearing as tolerated     Mobility  Bed Mobility Overal bed mobility: Needs Assistance Bed Mobility: Supine to Sit     Supine to sit: Supervision     General bed mobility comments: For safety only, no physical assist required.    Transfers Overall  transfer level: Needs assistance Equipment used: Rolling walker (2 wheels) Transfers: Sit to/from Stand Sit to Stand: Supervision           General transfer comment: Safety only    Ambulation/Gait Ambulation/Gait assistance: Min guard, Supervision Gait Distance (Feet): 150 Feet Assistive device: Rolling walker (2 wheels) Gait Pattern/deviations: Step-to pattern Gait velocity: decreased     General Gait Details: Pt ambulated with RW and min guard progressed to supervision 1564fwith no overt LOB or physical assist required.   Stairs             Wheelchair Mobility    Modified Rankin (Stroke Patients Only)       Balance Overall balance assessment: Needs assistance Sitting-balance support: No upper extremity supported, Feet supported Sitting balance-Leahy Scale: Good     Standing balance support: Single extremity supported, Reliant on assistive device for balance, During functional activity, Bilateral upper extremity supported Standing balance-Leahy Scale: Fair Standing balance comment: Pt able to complete stance with single UE support, dynamic activities required BUE support on RW                            Cognition Arousal/Alertness: Awake/alert Behavior During Therapy: WFL for tasks assessed/performed Overall Cognitive Status: Within Functional Limits for tasks assessed                                          Exercises Total Joint Exercises Ankle Circles/Pumps: AROM, Both, 10 reps, Seated Quad Sets: AROM, Both, 10 reps Short Arc Quad: AROM, Right, 10 reps Heel Slides: AROM, Right, 10 reps  Hip ABduction/ADduction: AROM, Right, 10 reps    General Comments        Pertinent Vitals/Pain Pain Assessment Pain Assessment: 0-10 Pain Score: 5  Pain Location: right hip Pain Descriptors / Indicators: Operative site guarding Pain Intervention(s): Repositioned, Monitored during session, Limited activity within patient's  tolerance, Ice applied    Home Living                          Prior Function            PT Goals (current goals can now be found in the care plan section) Acute Rehab PT Goals Patient Stated Goal: Walk without pain PT Goal Formulation: With patient Time For Goal Achievement: 07/25/22 Potential to Achieve Goals: Good Progress towards PT goals: Progressing toward goals    Frequency    7X/week      PT Plan Current plan remains appropriate    Co-evaluation              AM-PAC PT "6 Clicks" Mobility   Outcome Measure  Help needed turning from your back to your side while in a flat bed without using bedrails?: None Help needed moving from lying on your back to sitting on the side of a flat bed without using bedrails?: None Help needed moving to and from a bed to a chair (including a wheelchair)?: A Little Help needed standing up from a chair using your arms (e.g., wheelchair or bedside chair)?: A Little Help needed to walk in hospital room?: A Little Help needed climbing 3-5 steps with a railing? : A Little 6 Click Score: 20    End of Session Equipment Utilized During Treatment: Gait belt Activity Tolerance: Patient tolerated treatment well;No increased pain Patient left: in chair;with call bell/phone within reach;with chair alarm set Nurse Communication: Mobility status PT Visit Diagnosis: Pain;Difficulty in walking, not elsewhere classified (R26.2) Pain - Right/Left: Right Pain - part of body: Hip     Time: 0940-1006 PT Time Calculation (min) (ACUTE ONLY): 26 min  Charges:  $Gait Training: 8-22 mins $Therapeutic Exercise: 8-22 mins                     Coolidge Breeze, PT, DPT [facility] Office: [phone] Pager: [phone]   Coolidge Breeze 07/19/2022, 10:44 AM

## 2022-07-19 NOTE — Progress Notes (Signed)
PHYSICAL THERAPY TREATMENT  PT COMMENTS Pt seen for second visit POD1 in recliner. Reviewed HEP and answered questions to pt satisfaction. Pt supervision for transfers and ambulation, min assist for stair training with RW backwards, pt demonstrated safe technique. Pt has met mobility goals for safe discharge home, all education completed and pt has no further questions. PT is signing off, please reconsult if needs change. Thank you for this referral.   07/19/22 1209  PT Visit Information  Last PT Received On 07/19/22  Assistance Needed +1  History of Present Illness Pt is a 66yo male presenting s/p R-THA, AA on 07/18/22. PMH: hx of prostate cancer s/p prostatectomy, HTN  Subjective Data  Subjective I'm feeling good  Patient Stated Goal Walk without pain  Precautions  Precautions Fall  Restrictions  Weight Bearing Restrictions Yes  RLE Weight Bearing WBAT  Pain Assessment  Pain Assessment 0-10  Pain Score 5  Pain Location right hip  Pain Descriptors / Indicators Operative site guarding  Pain Intervention(s) Limited activity within patient's tolerance;Monitored during session;Repositioned;Ice applied  Cognition  Arousal/Alertness Awake/alert  Behavior During Therapy WFL for tasks assessed/performed  Overall Cognitive Status Within Functional Limits for tasks assessed  Bed Mobility  General bed mobility comments Pt OOB in recliner at entry and exit  Transfers  Overall transfer level Needs assistance  Equipment used Rolling walker (2 wheels)  Transfers Sit to/from Stand  Sit to Stand Supervision  General transfer comment Safety only  Ambulation/Gait  Ambulation/Gait assistance Supervision  Gait Distance (Feet) 200 Feet  Assistive device Rolling walker (2 wheels)  Gait Pattern/deviations Step-to pattern  General Gait Details Pt ambulated with RW and supervision 251ft with no overt LOB or physical assist required.  Gait velocity decreased  Stairs Yes  Stairs assistance Min assist   Stair Management Step to pattern;Backwards;No rails;With walker  Number of Stairs 4  General stair comments Pt educated on stair mobility backwards with RW, handout provided, verbalized understanding. Demonstrated safe technique with min assist no overt LOB  Balance  Overall balance assessment Needs assistance  Sitting-balance support No upper extremity supported;Feet supported  Sitting balance-Leahy Scale Good  Standing balance support Single extremity supported;Reliant on assistive device for balance;During functional activity;Bilateral upper extremity supported  Standing balance-Leahy Scale Fair  Standing balance comment Pt able to complete stance with single UE support, dynamic activities required BUE support on RW  PT - End of Session  Equipment Utilized During Treatment Gait belt  Activity Tolerance Patient tolerated treatment well;No increased pain  Patient left in chair;with call bell/phone within reach;with chair alarm set  Nurse Communication Mobility status   PT - Assessment/Plan  PT Plan Current plan remains appropriate  PT Visit Diagnosis Pain;Difficulty in walking, not elsewhere classified (R26.2)  Pain - Right/Left Right  Pain - part of body Hip  PT Frequency (ACUTE ONLY) 7X/week  Follow Up Recommendations Follow physician's recommendations for discharge plan and follow up therapies  Assistance recommended at discharge Set up Supervision/Assistance  Patient can return home with the following A little help with walking and/or transfers;A little help with bathing/dressing/bathroom;Assistance with cooking/housework;Assist for transportation;Help with stairs or ramp for entrance  PT equipment Rolling walker (2 wheels)  AM-PAC PT "6 Clicks" Mobility Outcome Measure (Version 2)  Help needed turning from your back to your side while in a flat bed without using bedrails? 4  Help needed moving from lying on your back to sitting on the side of a flat bed without using bedrails? 4   Help  needed moving to and from a bed to a chair (including a wheelchair)? 3  Help needed standing up from a chair using your arms (e.g., wheelchair or bedside chair)? 3  Help needed to walk in hospital room? 3  Help needed climbing 3-5 steps with a railing?  3  6 Click Score 20  Consider Recommendation of Discharge To: Home with no services  Progressive Mobility  What is the highest level of mobility based on the progressive mobility assessment? Level 5 (Walks with assist in room/hall) - Balance while stepping forward/back and can walk in room with assist - Complete  Activity Ambulated with assistance in hallway  PT Goal Progression  Progress towards PT goals Progressing toward goals  Acute Rehab PT Goals  PT Goal Formulation With patient  Time For Goal Achievement 07/25/22  Potential to Achieve Goals Good  PT Time Calculation  PT Start Time (ACUTE ONLY) 1117  PT Stop Time (ACUTE ONLY) 1135  PT Time Calculation (min) (ACUTE ONLY) 18 min  PT General Charges  $$ ACUTE PT VISIT 1 Visit  PT Treatments  $Gait Training 8-22 mins   Coolidge Breeze, PT, DPT [facility] Department Office: [phone] Pager: [phone]

## 2022-07-19 NOTE — Plan of Care (Signed)
  Problem: Education: Goal: Knowledge of General Education information will improve Description: Including pain rating scale, medication(s)/side effects and non-pharmacologic comfort measures Outcome: Progressing   Problem: Activity: Goal: Risk for activity intolerance will decrease Outcome: Progressing   Problem: Pain Managment: Goal: General experience of comfort will improve Outcome: Progressing   

## 2022-07-19 NOTE — Discharge Summary (Signed)
Physician Discharge Summary  Patient ID: Eugene Marshall MRN: [id number] DOB/AGE: 03/03/1956 66 y.o.  Admit date: 07/18/2022 Discharge date: 07/19/2022  Admission Diagnoses: right hip osteoarthritis  Discharge Diagnoses:  Principal Problem:   S/P total right hip arthroplasty   Discharged Condition: fair  Hospital Course: Patient underwent a right THA by Dr. Percell Miller on 01/14/66 Dr. Percell Miller on 01/14/57 without complications. He spent the night in observation for pain control and mobilization. He has passed his PT evaluation and is ready to discharge home.  Consults: None  Significant Diagnostic Studies: n/a  Treatments: IV hydration, antibiotics: Ancef, analgesia: acetaminophen, Dilaudid, and Tramadol, anticoagulation: ASA, therapies: PT and OT, and surgery: right THA  Discharge Exam: Blood pressure 132/84, pulse (!) 57, temperature 98.2 F (36.8 C), resp. rate 17, height '5\' 11"'$  (1.803 m), weight 79.4 kg, SpO2 100 %. General appearance: alert, cooperative, and no distress Head: Normocephalic, without obvious abnormality, atraumatic Resp: clear to auscultation bilaterally Cardio: regular rate and rhythm, S1, S2 normal, no murmur, click, rub or gallop GI: soft, non-tender; bowel sounds normal; no masses,  no organomegaly Extremities: extremities normal, atraumatic, no cyanosis or edema Pulses:  L brachial 2+ R brachial 2+  L radial 2+ R radial 2+  L inguinal 2+ R inguinal 2+  L popliteal 2+ R popliteal 2+  L posterior tibial 2+ R posterior tibial 2+  L dorsalis pedis 2+ R dorsalis pedis 2+   Neurologic: Grossly normal Incision/Wound: c/d/i  Disposition: Discharge disposition: 01-Home or Self Care       Discharge Instructions     Call MD / Call 911   Complete by: As directed    If you experience chest pain or shortness of breath, CALL 911 and be transported to the hospital emergency room.  If you develope a fever above 101 F, pus (white drainage) or increased drainage or redness at the wound, or calf  pain, call your surgeon's office.   Diet - low sodium heart healthy   Complete by: As directed    Discharge instructions   Complete by: As directed    You may bear weight as tolerated. Keep your dressing on and dry until follow up. Take medicine to prevent blood clots as directed. Take pain medicine as needed with the goal of transitioning to over the counter medicines.    INSTRUCTIONS AFTER JOINT REPLACEMENT   Remove items at home which could result in a fall. This includes throw rugs or furniture in walking pathways ICE to the affected joint every three hours while awake for 30 minutes at a time, for at least the first 3-5 days, and then as needed for pain and swelling.  Continue to use ice for pain and swelling. You may notice swelling that will progress down to the foot and ankle.  This is normal after surgery.  Elevate your leg when you are not up walking on it.   Continue to use the breathing machine you got in the hospital (incentive spirometer) which will help keep your temperature down.  It is common for your temperature to cycle up and down following surgery, especially at night when you are not up moving around and exerting yourself.  The breathing machine keeps your lungs expanded and your temperature down.   DIET:  As you were doing prior to hospitalization, we recommend a well-balanced diet.  DRESSING / WOUND CARE / SHOWERING  You may shower 3 days after surgery, but keep the wounds dry during showering.  You may use an occlusive plastic wrap (Press'n Seal  for example) with blue painter's tape at edges, NO SOAKING/SUBMERGING IN THE BATHTUB.  If the bandage gets wet, call the office.   ACTIVITY  Increase activity slowly as tolerated, but follow the weight bearing instructions below.   No driving for 6 weeks or until further direction given by your physician.  You cannot drive while taking narcotics.  No lifting or carrying greater than 10 lbs. until further directed by your  surgeon. Avoid periods of inactivity such as sitting longer than an hour when not asleep. This helps prevent blood clots.  You may return to work once you are authorized by your doctor.    WEIGHT BEARING   Weight bearing as tolerated with assist device (walker, cane, etc) as directed, use it as long as suggested by your surgeon or therapist, typically at least 4-6 weeks.   EXERCISES  Results after joint replacement surgery are often greatly improved when you follow the exercise, range of motion and muscle strengthening exercises prescribed by your doctor. Safety measures are also important to protect the joint from further injury. Any time any of these exercises cause you to have increased pain or swelling, decrease what you are doing until you are comfortable again and then slowly increase them. If you have problems or questions, call your caregiver or physical therapist for advice.   Rehabilitation is important following a joint replacement. After just a few days of immobilization, the muscles of the leg can become weakened and shrink (atrophy).  These exercises are designed to build up the tone and strength of the thigh and leg muscles and to improve motion. Often times heat used for twenty to thirty minutes before working out will loosen up your tissues and help with improving the range of motion but do not use heat for the first two weeks following surgery (sometimes heat can increase post-operative swelling).   These exercises can be done on a training (exercise) mat, on the floor, on a table or on a bed. Use whatever works the best and is most comfortable for you.    Use music or television while you are exercising so that the exercises are a pleasant break in your day. This will make your life better with the exercises acting as a break in your routine that you can look forward to.   Perform all exercises about fifteen times, three times per day or as directed.  You should exercise both the  operative leg and the other leg as well.  Exercises include:   Quad Sets - Tighten up the muscle on the front of the thigh (Quad) and hold for 5-10 seconds.   Straight Leg Raises - With your knee straight (if you were given a brace, keep it on), lift the leg to 60 degrees, hold for 3 seconds, and slowly lower the leg.  Perform this exercise against resistance later as your leg gets stronger.  Leg Slides: Lying on your back, slowly slide your foot toward your buttocks, bending your knee up off the floor (only go as far as is comfortable). Then slowly slide your foot back down until your leg is flat on the floor again.  Angel Wings: Lying on your back spread your legs to the side as far apart as you can without causing discomfort.  Hamstring Strength:  Lying on your back, push your heel against the floor with your leg straight by tightening up the muscles of your buttocks.  Repeat, but this time bend your knee to a comfortable angle,  and push your heel against the floor.  You may put a pillow under the heel to make it more comfortable if necessary.   A rehabilitation program following joint replacement surgery can speed recovery and prevent re-injury in the future due to weakened muscles. Contact your doctor or a physical therapist for more information on knee rehabilitation.    CONSTIPATION  Constipation is defined medically as fewer than three stools per week and severe constipation as less than one stool per week.  Even if you have a regular bowel pattern at home, your normal regimen is likely to be disrupted due to multiple reasons following surgery.  Combination of anesthesia, postoperative narcotics, change in appetite and fluid intake all can affect your bowels.   YOU MUST use at least one of the following options; they are listed in order of increasing strength to get the job done.  They are all available over the counter, and you may need to use some, POSSIBLY even all of these options:     Drink plenty of fluids (prune juice may be helpful) and high fiber foods Colace 100 mg by mouth twice a day  Senokot for constipation as directed and as needed Dulcolax (bisacodyl), take with full glass of water  Miralax (polyethylene glycol) once or twice a day as needed.  If you have tried all these things and are unable to have a bowel movement in the first 3-4 days after surgery call either your surgeon or your primary doctor.    If you experience loose stools or diarrhea, hold the medications until you stool forms back up.  If your symptoms do not get better within 1 week or if they get worse, check with your doctor.  If you experience "the worst abdominal pain ever" or develop nausea or vomiting, please contact the office immediately for further recommendations for treatment.   ITCHING:  If you experience itching with your medications, try taking only a single pain pill, or even half a pain pill at a time.  You can also use Benadryl over the counter for itching or also to help with sleep.   TED HOSE STOCKINGS:  Use stockings on both legs until for at least 2 weeks or as directed by physician office. They may be removed at night for sleeping.  MEDICATIONS:  See your medication summary on the "After Visit Summary" that nursing will review with you.  You may have some home medications which will be placed on hold until you complete the course of blood thinner medication.  It is important for you to complete the blood thinner medication as prescribed.  Take medicines as prescribed.   You have several different medicines that work in different ways. - Tylenol is for mild to moderate pain. Try to take this medicine before turning to your narcotic medicines.  - Meloxicam is to reduce pain / inflammation - Robaxin is for muscle spasms. This medicine can make you drowsy. - Oxycodone is a narcotic pain medicine.  Take this for severe pain. This medicine can be dehydrating / constipating. -  Zofran is for nausea and vomiting. - Colace is for constipation prevention - Aspirin is to prevent blood clots after surgery. YOU MUST TAKE THIS MEDICINE!!  PRECAUTIONS:  If you experience chest pain or shortness of breath - call 911 immediately for transfer to the hospital emergency department.   If you develop a fever greater that 101 F, purulent drainage from wound, increased redness or drainage from wound, foul odor from  the wound/dressing, or calf pain - CONTACT YOUR SURGEON.                                                   FOLLOW-UP APPOINTMENTS:  If you do not already have a post-op appointment, please call the office [phone] for an appointment to be seen by Dr. Percell Miller in 2 weeks.   OTHER INSTRUCTIONS:   MAKE SURE YOU:  Understand these instructions.  Get help right away if you are not doing well or get worse.    Thank you for letting us be a part of your medical care team.  It is a privilege we respect greatly.  We hope these instructions will help you stay on track for a fast and full recovery!   Driving restrictions   Complete by: As directed    No driving for 2-4 weeks   Post-operative opioid taper instructions:   Complete by: As directed    POST-OPERATIVE OPIOID TAPER INSTRUCTIONS: It is important to wean off of your opioid medication as soon as possible. If you do not need pain medication after your surgery it is ok to stop day one. Opioids include: Codeine, Hydrocodone(Norco, Vicodin), Oxycodone(Percocet, oxycontin) and hydromorphone amongst others.  Long term and even short term use of opiods can cause: Increased pain response Dependence Constipation Depression Respiratory depression And more.  Withdrawal symptoms can include Flu like symptoms Nausea, vomiting And more Techniques to manage these symptoms Hydrate well Eat regular healthy meals Stay active Use relaxation techniques(deep breathing, meditating, yoga) Do Not substitute Alcohol to help with  tapering If you have been on opioids for less than two weeks and do not have pain than it is ok to stop all together.  Plan to wean off of opioids This plan should start within one week post op of your joint replacement. Maintain the same interval or time between taking each dose and first decrease the dose.  Cut the total daily intake of opioids by one tablet each day Next start to increase the time between doses. The last dose that should be eliminated is the evening dose.      TED hose   Complete by: As directed    Use stockings (TED hose) for 2 weeks on right leg(s).  You may remove them at night for sleeping.   Weight bearing as tolerated   Complete by: As directed    Laterality: right   Extremity: Lower      Allergies as of 07/19/2022       Reactions   Sulfacetamide Hives   Penicillins Other (See Comments)   Stopped breathing        Medication List     STOP taking these medications    naproxen sodium 220 MG tablet Commonly known as: ALEVE       TAKE these medications    acetaminophen 500 MG tablet Commonly known as: TYLENOL Take 2 tablets (1,000 mg total) by mouth every 6 (six) hours as needed for mild pain or moderate pain.   aspirin EC 81 MG tablet Take 1 tablet (81 mg total) by mouth 2 (two) times daily. To prevent blood clots for 30 days after surgery.   EMERGEN-C VITAMIN C PO Take 1 tablet by mouth 3 (three) times a week.   meloxicam 15 MG tablet Commonly known as: MOBIC Take 1 tablet (  15 mg total) by mouth daily as needed for pain (and inflammation).   methocarbamol 750 MG tablet Commonly known as: Robaxin-750 Take 1 tablet (750 mg total) by mouth every 8 (eight) hours as needed for muscle spasms.   ondansetron 4 MG disintegrating tablet Commonly known as: ZOFRAN-ODT Take 1 tablet (4 mg total) by mouth 2 (two) times daily as needed for nausea or vomiting.   oxyCODONE 5 MG immediate release tablet Commonly known as: Roxicodone Take 1 tablet  (5 mg total) by mouth every 6 (six) hours as needed for severe pain. Do not take more than 6 tablets in a 24 hour period.               Discharge Care Instructions  (From admission, onward)           Start     Ordered   07/19/22 0000  Weight bearing as tolerated       Question Answer Comment  Laterality right   Extremity Lower      07/19/22 1449            Follow-up Information     Renette Butters, MD. Go on 08/02/2022.   Specialty: Orthopedic Surgery Why: at 4:15pm Contact information: [address] [place] [address] [phone]                 Signed: Alisa Graff 07/19/2022, 2:49 PM

## 2022-07-19 NOTE — TOC Transition Note (Signed)
Transition of Care [facility]) - CM/SW Discharge Note   Patient Details  Name: Eugene Marshall MRN: [id number] Date of Birth: August 20, 1956  [facility] CM/SW Contact:  Lennart Pall, LCSW Phone Number: 07/19/2022, 2:23 PM   Clinical Narrative:     Met with pt and confirming he has received RW via [place].  OPPT already set up with [facility].  No TOC needs.  Final next level of care: OP Rehab Barriers to Discharge: No Barriers Identified   Patient Goals and CMS Choice Patient states their goals for this hospitalization and ongoing recovery are:: return home      Discharge Placement                       Discharge Plan and Services                DME Arranged: Walker rolling DME Agency: [place]                  Social Determinants of Health (SDOH) Interventions     Readmission Risk Interventions     No data to display

## 2022-12-29 ENCOUNTER — Encounter (HOSPITAL_BASED_OUTPATIENT_CLINIC_OR_DEPARTMENT_OTHER): Payer: Self-pay | Admitting: Cardiology

## 2022-12-29 ENCOUNTER — Ambulatory Visit (INDEPENDENT_AMBULATORY_CARE_PROVIDER_SITE_OTHER): Payer: Medicare Other | Admitting: Cardiology

## 2022-12-29 VITALS — BP 128/70 | Ht 71.0 in | Wt 178.1 lb

## 2022-12-29 DIAGNOSIS — Z7189 Other specified counseling: Secondary | ICD-10-CM

## 2022-12-29 DIAGNOSIS — R079 Chest pain, unspecified: Secondary | ICD-10-CM

## 2022-12-29 DIAGNOSIS — Z8249 Family history of ischemic heart disease and other diseases of the circulatory system: Secondary | ICD-10-CM | POA: Diagnosis not present

## 2022-12-29 DIAGNOSIS — Z13220 Encounter for screening for lipoid disorders: Secondary | ICD-10-CM

## 2022-12-29 MED ORDER — METOPROLOL TARTRATE 25 MG PO TABS: ORAL_TABLET | ORAL | 0 refills | Status: DC

## 2022-12-29 NOTE — Progress Notes (Signed)
Cardiology Office Note:    Date:  12/29/2022   ID:  Eugene Marshall, DOB 1956/02/22, MRN [id number]  PCP:  Kristie Cowman, MD  Cardiologist:  Buford Dresser, MD  Referring MD: Kristie Cowman, MD   CC: new patient evaluation for possible diastolic dysfunctio n  History of Present Illness:    Eugene Marshall is a 67 y.o. male who is seen as a new consult at the request of Kristie Cowman, MD for the evaluation and management of diastolic dysfunction.  Reviewed referral notes from Dr. Ronnald Ramp. Had stress test done at [facility] last year, reviewed. Referred to [facility] cardiology but requested evaluation by heartcare.  No tobacco use, rare alcohol, uses MJ. Drinks a lot of sweet tea. Highest adult weight was 203 lbs. When weight was high, he was watching his blood pressure. Has never needed to be on BP medications.  Family history: Mother died age 81 of MI. Had high blood pressure. No other MI. Father is 41 and healthy other than dementia.   Feels intermittent chest pressure, occurring less than daily. Started on aspirin PRN, which has helped. Pain is localized, under left breast, no associated symptoms. Sensation is dull but focal, affected by palpation.  Has been told in the past that he has an abnormal heart rhythm in the [employer]. No severe symptoms, has occasional palpitations. No syncope.  [place] [employer] [place], no stent, not told there was anything that needed to be treated.  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 To prevent blood clots for 30 days after surgery.   naproxen sodium (ALEVE) 220 MG tablet Take 220 mg by mouth as needed.   No current facility-administered medications on file prior to visit.     Allergies:   Sulfacetamide and Penicillins   Social History   Tobacco Use   Smoking status: Never  Vaping Use   Vaping Use: Never used  Substance Use Topics   Alcohol use: Not Currently   Drug use: Yes    Types: Marijuana    Family History: Mother died age 66 of MI. Had high blood pressure. No other MI. Father is 27 and healthy other than dementia.   ROS:   Please see the history of present illness.  Additional pertinent ROS: Constitutional: Negative for chills, fever, night sweats, unintentional weight loss  HENT: Negative for ear pain and hearing loss.   Eyes: Negative for loss of vision and eye pain.  Respiratory: Negative for cough, sputum, wheezing.   Cardiovascular: See HPI. Gastrointestinal: Negative for abdominal pain, melena, and hematochezia.  Genitourinary: Negative for dysuria and hematuria.  Musculoskeletal: Negative for falls and myalgias.  Skin: Negative for itching and rash.  Neurological: Negative for focal weakness, focal sensory changes and loss of consciousness.  Endo/Heme/Allergies: Does not bruise/bleed easily.     EKGs/Labs/Other Studies Reviewed:    The following studies were reviewed today: No prior  records available to me  EKG:  EKG is personally reviewed.   12/29/22: NSR at 72 bpm, t wave inversions inferolateral leads  Recent Labs: 07/06/2022: BUN 10; Creatinine, Ser 1.00; Hemoglobin 16.5; Platelets 113; Potassium 3.9; Sodium 141  Recent Lipid Panel No results found for: "CHOL", "TRIG", "HDL", "CHOLHDL", "VLDL", "LDLCALC", "LDLDIRECT"  Physical Exam:    VS:  BP 128/70 (BP Location: Left Arm, Patient Position: Sitting, Cuff Size: Normal)   Ht '5\' 11"'$  (1.803 m)   Wt 178 lb  1.6 oz (80.8 kg)   BMI 24.84 kg/m     Wt Readings from Last 3 Encounters:  12/29/22 178 lb 1.6 oz (80.8 kg)  07/18/22 175 lb (79.4 kg)  07/06/22 175 lb (79.4 kg)    GEN: Well nourished, well developed in no acute distress HEENT: Normal, moist mucous membranes NECK: No JVD CARDIAC: regular rhythm, normal S1 and S2, no rubs or gallops. No murmur. VASCULAR: Radial and DP pulses 2+ bilaterally. No carotid bruits RESPIRATORY:  Clear to auscultation without rales, wheezing or rhonchi  ABDOMEN: Soft, non-tender, non-distended MUSCULOSKELETAL:  Ambulates independently SKIN: Warm and dry, no edema NEUROLOGIC:  Alert and oriented x 3. No focal neuro deficits noted. PSYCHIATRIC:  Normal affect    ASSESSMENT:    1. Chest pain of uncertain etiology   2. Need for lipid screening   3. Family history of heart disease   4. Cardiac risk counseling   5. Counseling on health promotion and disease prevention    PLAN:    Concern for diastolic dysfunction: I do not have any records. No clinical signs or symptoms  Chest pain -discussed treadmill stress, nuclear stress/lexiscan, and CT coronary angiography. Discussed pros and cons of each, including but not limited to false positive/false negative risk, radiation risk, and risk of IV contrast dye. Based on shared decision making, decision was made to pursue CT coronary angiography. -will give one time dose of metoprolol 2 hours prior to scheduled test -counseled on need to get BMET prior to test -counseled on use of sublingual nitroglycerin and its importance to a good test -reviewed red flag warning signs that need immediate medical attention  -check labs  Cardiac risk counseling and prevention recommendations: family history of heart disease -recommend heart healthy/Mediterranean diet, with whole grains, fruits, vegetable, fish, lean meats, nuts, and olive oil. Limit salt. -recommend moderate walking, 3-5 times/week for 30-50 minutes each  session. Aim for at least 150 minutes.week. Goal should be pace of 3 miles/hours, or walking 1.5 miles in 30 minutes -recommend avoidance of tobacco products. Avoid excess alcohol. -ASCVD risk score: The 10-year ASCVD risk score (Arnett DK, et al., 2019) is: 11.8%   Values used to calculate the score:     Age: 44 years     Sex: Male     Is Non-Hispanic African American: Yes     Diabetic: No     Tobacco smoker: No     Systolic Blood Pressure: 0000000 mmHg     Is BP treated: No     HDL Cholesterol: 29 mg/dL     Total Cholesterol: 164 mg/dL    Plan for follow up: 4-6 weeks  Buford Dresser, MD, PhD, [facility]    Medication Adjustments/Labs and Tests Ordered: Current medicines are reviewed at length with the patient today.  Concerns regarding medicines are outlined above.  Orders Placed This Encounter  Procedures   CT CORONARY MORPH W/CTA COR W/SCORE W/CA W/CM &/OR WO/CM   Lipid  panel   Basic metabolic panel   EKG [id number]   Meds ordered this encounter  Medications   DISCONTD: metoprolol tartrate (LOPRESSOR) 25 MG tablet    Sig: TAKE 1 TABLET 2 HOURS PRIOR TO CT    Dispense:  1 tablet    Refill:  0    Patient Instructions  Medication Instructions:  TAKE METOPROLOL 25 MG 2 HOURS PRIOR TO CT   *If you need a refill on your cardiac medications before your next appointment, please call your pharmacy*  Lab Work: FASTING LP/BMET SOON   If you have labs (blood work) drawn today and your tests are completely normal, you will receive your results only by: MyChart Message (if you have MyChart) OR A paper copy in the mail If you have any lab test that is abnormal or we need to change your treatment, we will call you to review the results.  Testing/Procedures: Your physician has requested that you have cardiac CT. Cardiac computed tomography (CT) is a painless test that uses an x-ray machine to take clear, detailed pictures of your heart. For further  information please visit [email or web address]. Please follow instruction sheet as given.  Follow-Up: At [facility], you and your health needs are our priority.  As part of our continuing mission to provide you with exceptional heart care, we have created designated Provider Care Teams.  These Care Teams include your primary Cardiologist (physician) and Advanced Practice Providers (APPs -  Physician Assistants and Nurse Practitioners) who all work together to provide you with the care you need, when you need it.  We recommend signing up for the patient portal called "MyChart".  Sign up information is provided on this After Visit Summary.  MyChart is used to connect with patients for Virtual Visits (Telemedicine).  Patients are able to view lab/test results, encounter notes, upcoming appointments, etc.  Non-urgent messages can be sent to your provider as well.   To learn more about what you can do with MyChart, go to [email or web address].    Your next appointment:   4-6 week(s)  Provider:   Buford Dresser, MD    Other Instructions   Your cardiac CT will be scheduled at one of the below locations:   [facility] [address] [place], [place] [address] [phone]  [facility] [address] [place], [place] [address] [phone]  [facility] [place], [place] [address] [phone]  If scheduled at [facility], please arrive at the [facility] and Children's Entrance (Entrance C2) of [facility] 30 minutes prior to test start time. You can use the FREE valet parking offered at entrance C (encouraged to control the heart rate for the test)  Proceed to the [facility] Radiology Department (first floor) to check-in and test prep.  All radiology patients and guests should use entrance C2 at [facility], accessed from [facility], even though the hospital's physical address listed is [address].    If scheduled at [facility] or [facility], please arrive 15 mins early for check-in and test prep.   Please follow these instructions carefully (unless otherwise directed):  Hold all erectile dysfunction medications at least 3 days (72 hrs) prior to test. (Ie viagra, cialis, sildenafil, tadalafil, etc) We will administer nitroglycerin during this exam.   On the Night Before the Test: Be sure to Drink  plenty of water. Do not consume any caffeinated/decaffeinated beverages or chocolate 12 hours prior to your test. Do not take any antihistamines 12 hours prior to your test. If the patient has contrast allergy: Patient will need a prescription for Prednisone and very clear instructions (as follows): Prednisone 50 mg - take 13 hours prior to test Take another Prednisone 50 mg 7 hours prior to test Take another Prednisone 50 mg 1 hour prior to test Take Benadryl 50 mg 1 hour prior to test Patient must complete all four doses of above prophylactic medications. Patient will need a ride after test due to Benadryl.  On the Day of the Test: Drink plenty of water until 1 hour prior to the test. Do not eat any food 1 hour prior to test. You may take your regular medications prior to the test.  Take metoprolol (Lopressor) two hours prior to test. HOLD Furosemide/Hydrochlorothiazide morning of the test.  After the Test: Drink plenty of water. After receiving IV contrast, you may experience a mild flushed feeling. This is normal. On occasion, you may experience a mild rash up to 24 hours after the test. This is not dangerous. If this occurs, you can take Benadryl 25 mg and increase your fluid intake. If you experience trouble breathing, this can be serious. If it is severe call 911 IMMEDIATELY. If it is mild, please call our office. If you take any of these  medications: Glipizide/Metformin, Avandament, Glucavance, please do not take 48 hours after completing test unless otherwise instructed.  We will call to schedule your test 2-4 weeks out understanding that some insurance companies will need an authorization prior to the service being performed.   For non-scheduling related questions, please contact the cardiac imaging nurse navigator should you have any questions/concerns: Marchia Bond, Cardiac Imaging Nurse Navigator Gordy Clement, Cardiac Imaging Nurse Navigator [facility] Direct Office Dial: [phone]   For scheduling needs, including cancellations and rescheduling, please call [place], [phone].  Cardiac CT Angiogram A cardiac CT angiogram is a procedure to look at the heart and the area around the heart. It may be done to help find the cause of chest pains or other symptoms of heart disease. During this procedure, a substance called contrast dye is injected into the blood vessels in the area to be checked. A large X-ray machine, called a CT scanner, then takes detailed pictures of the heart and the surrounding area. The procedure is also sometimes called a coronary CT angiogram, coronary artery scanning, or CTA. A cardiac CT angiogram allows the health care provider to see how well blood is flowing to and from the heart. The health care provider will be able to see if there are any problems, such as: Blockage or narrowing of the coronary arteries in the heart. Fluid around the heart. Signs of weakness or disease in the muscles, valves, and tissues of the heart. Tell a health care provider about: Any allergies you have. This is especially important if you have had a previous allergic reaction to contrast dye. All medicines you are taking, including vitamins, herbs, eye drops, creams, and over-the-counter medicines. Any blood disorders you have. Any surgeries you have had. Any medical conditions you  have. Whether you are pregnant or may be pregnant. Any anxiety disorders, chronic pain, or other conditions you have that may increase your stress or prevent you from lying still. What are the risks? Generally, this is a safe procedure. However, problems may occur, including: Bleeding. Infection.  Allergic reactions to medicines or dyes. Damage to other structures or organs. Kidney damage from the contrast dye that is used. Increased risk of cancer from radiation exposure. This risk is low. Talk with your health care provider about: The risks and benefits of testing. How you can receive the lowest dose of radiation. What happens before the procedure? Wear comfortable clothing and remove any jewelry, glasses, dentures, and hearing aids. Follow instructions from your health care provider about eating and drinking. This may include: For 12 hours before the procedure -- avoid caffeine. This includes tea, coffee, soda, energy drinks, and diet pills. Drink plenty of water or other fluids that do not have caffeine in them. Being well hydrated can prevent complications. For 4-6 hours before the procedure -- stop eating and drinking. The contrast dye can cause nausea, but this is less likely if your stomach is empty. Ask your health care provider about changing or stopping your regular medicines. This is especially important if you are taking diabetes medicines, blood thinners, or medicines to treat problems with erections (erectile dysfunction). What happens during the procedure?  Hair on your chest may need to be removed so that small sticky patches called electrodes can be placed on your chest. These will transmit information that helps to monitor your heart during the procedure. An IV will be inserted into one of your veins. You might be given a medicine to control your heart rate during the procedure. This will help to ensure that good images are obtained. You will be asked to lie on an exam  table. This table will slide in and out of the CT machine during the procedure. Contrast dye will be injected into the IV. You might feel warm, or you may get a metallic taste in your mouth. You will be given a medicine called nitroglycerin. This will relax or dilate the arteries in your heart. The table that you are lying on will move into the CT machine tunnel for the scan. The person running the machine will give you instructions while the scans are being done. You may be asked to: Keep your arms above your head. Hold your breath. Stay very still, even if the table is moving. When the scanning is complete, you will be moved out of the machine. The IV will be removed. The procedure may vary among health care providers and hospitals. What can I expect after the procedure? After your procedure, it is common to have: A metallic taste in your mouth from the contrast dye. A feeling of warmth. A headache from the nitroglycerin. Follow these instructions at home: Take over-the-counter and prescription medicines only as told by your health care provider. If you are told, drink enough fluid to keep your urine pale yellow. This will help to flush the contrast dye out of your body. Most people can return to their normal activities right after the procedure. Ask your health care provider what activities are safe for you. It is up to you to get the results of your procedure. Ask your health care provider, or the department that is doing the procedure, when your results will be ready. Keep all follow-up visits as told by your health care provider. This is important. Contact a health care provider if: You have any symptoms of allergy to the contrast dye. These include: Shortness of breath. Rash or hives. A racing heartbeat. Summary A cardiac CT angiogram is a procedure to look at the heart and the area around the heart. It may  be done to help find the cause of chest pains or other symptoms of heart  disease. During this procedure, a large X-ray machine, called a CT scanner, takes detailed pictures of the heart and the surrounding area after a contrast dye has been injected into blood vessels in the area. Ask your health care provider about changing or stopping your regular medicines before the procedure. This is especially important if you are taking diabetes medicines, blood thinners, or medicines to treat erectile dysfunction. If you are told, drink enough fluid to keep your urine pale yellow. This will help to flush the contrast dye out of your body. This information is not intended to replace advice given to you by your health care provider. Make sure you discuss any questions you have with your health care provider. Document Revised: 03/16/2022 Document Reviewed: 07/23/2019 Elsevier Patient Education  [address]   Signed, Buford Dresser, MD PhD 12/29/2022     Haivana Nakya

## 2022-12-29 NOTE — Patient Instructions (Signed)
Medication Instructions:  TAKE METOPROLOL 25 MG 2 HOURS PRIOR TO CT   *If you need a refill on your cardiac medications before your next appointment, please call your pharmacy*  Lab Work: FASTING LP/BMET SOON   If you have labs (blood work) drawn today and your tests are completely normal, you will receive your results only by: [place] (if you have MyChart) OR A paper copy in the mail If you have any lab test that is abnormal or we need to change your treatment, we will call you to review the results.  Testing/Procedures: Your physician has requested that you have cardiac CT. Cardiac computed tomography (CT) is a painless test that uses an x-ray machine to take clear, detailed pictures of your heart. For further information please visit [email or web address]. Please follow instruction sheet as given.  Follow-Up: At [facility], you and your health needs are our priority.  As part of our continuing mission to provide you with exceptional heart care, we have created designated Provider Care Teams.  These Care Teams include your primary Cardiologist (physician) and Advanced Practice Providers (APPs -  Physician Assistants and Nurse Practitioners) who all work together to provide you with the care you need, when you need it.  We recommend signing up for the patient portal called "MyChart".  Sign up information is provided on this After Visit Summary.  MyChart is used to connect with patients for Virtual Visits (Telemedicine).  Patients are able to view lab/test results, encounter notes, upcoming appointments, etc.  Non-urgent messages can be sent to your provider as well.   To learn more about what you can do with MyChart, go to [email or web address].    Your next appointment:   4-6 week(s)  Provider:   Buford Dresser, MD    Other Instructions   Your cardiac CT will be scheduled at one of the below locations:   [facility] [address] [place], [place] [address] [phone]  [place] [address] [place], [place] [address] [phone]  [facility] [place], [place] [address] [phone]  If scheduled at [facility], please arrive at the [facility] and Children's Entrance (Entrance C2) of [facility] 30 minutes prior to test start time. You can use the FREE valet parking offered at entrance C (encouraged to control the heart rate for the test)  Proceed to the [facility] Radiology Department (first floor) to check-in and test prep.  All radiology patients and guests should use entrance C2 at [facility], accessed from [facility], even though the hospital's physical address listed is [address].    If scheduled at [facility] or [facility], please arrive 15 mins early for check-in and test prep.   Please follow these instructions carefully (unless otherwise directed):  Hold all erectile dysfunction medications at least 3 days (72 hrs) prior to test. (Ie viagra, cialis, sildenafil, tadalafil, etc) We will administer nitroglycerin during this exam.   On the Night Before the Test: Be sure to Drink plenty of water. Do not consume any caffeinated/decaffeinated beverages or chocolate 12 hours prior to your test. Do not take any antihistamines 12 hours prior to your test. If the patient has contrast allergy: Patient will need a prescription for Prednisone and very clear instructions (as follows): Prednisone 50 mg - take 13 hours prior to test Take another  Prednisone 50 mg 7 hours prior to test Take another Prednisone 50 mg 1 hour prior to test Take Benadryl 50 mg 1 hour prior to test Patient must complete all four doses of above prophylactic medications. Patient will need a ride after test due to Benadryl.  On the Day  of the Test: Drink plenty of water until 1 hour prior to the test. Do not eat any food 1 hour prior to test. You may take your regular medications prior to the test.  Take metoprolol (Lopressor) two hours prior to test. HOLD Furosemide/Hydrochlorothiazide morning of the test.  After the Test: Drink plenty of water. After receiving IV contrast, you may experience a mild flushed feeling. This is normal. On occasion, you may experience a mild rash up to 24 hours after the test. This is not dangerous. If this occurs, you can take Benadryl 25 mg and increase your fluid intake. If you experience trouble breathing, this can be serious. If it is severe call 911 IMMEDIATELY. If it is mild, please call our office. If you take any of these medications: Glipizide/Metformin, Avandament, Glucavance, please do not take 48 hours after completing test unless otherwise instructed.  We will call to schedule your test 2-4 weeks out understanding that some insurance companies will need an authorization prior to the service being performed.   For non-scheduling related questions, please contact the cardiac imaging nurse navigator should you have any questions/concerns: Marchia Bond, Cardiac Imaging Nurse Navigator Gordy Clement, Cardiac Imaging Nurse Navigator [facility] Direct Office Dial: [phone]   For scheduling needs, including cancellations and rescheduling, please call [place], [phone].  Cardiac CT Angiogram A cardiac CT angiogram is a procedure to look at the heart and the area around the heart. It may be done to help find the cause of chest pains or other symptoms of heart disease. During this procedure, a substance called contrast dye is injected into the blood vessels in the area to be checked. A large X-ray machine, called a CT scanner, then takes detailed pictures of the heart and the surrounding area. The procedure is also sometimes called a coronary CT  angiogram, coronary artery scanning, or CTA. A cardiac CT angiogram allows the health care provider to see how well blood is flowing to and from the heart. The health care provider will be able to see if there are any problems, such as: Blockage or narrowing of the coronary arteries in the heart. Fluid around the heart. Signs of weakness or disease in the muscles, valves, and tissues of the heart. Tell a health care provider about: Any allergies you have. This is especially important if you have had a previous allergic reaction to contrast dye. All medicines you are taking, including vitamins, herbs, eye drops, creams, and over-the-counter medicines. Any blood disorders you have. Any surgeries you have had. Any medical conditions you have. Whether you are pregnant or may be pregnant. Any anxiety disorders, chronic pain, or other conditions you have that may increase your stress or prevent you from lying still. What are the risks? Generally, this is a safe procedure. However, problems may occur, including: Bleeding. Infection. Allergic reactions to medicines or dyes. Damage to other structures or organs. Kidney damage from the contrast dye that is used. Increased risk of cancer from radiation exposure. This risk is low. Talk with your health care provider about: The risks and benefits of testing. How you can receive the lowest dose of radiation. What happens before the procedure?  Wear comfortable clothing and remove any jewelry, glasses, dentures, and hearing aids. Follow instructions from your health care provider about eating and drinking. This may include: For 12 hours before the procedure -- avoid caffeine. This includes tea, coffee, soda, energy drinks, and diet pills. Drink plenty of water or other fluids that do not have caffeine in them. Being well hydrated can prevent complications. For 4-6 hours before the procedure -- stop eating and drinking. The contrast dye can cause nausea,  but this is less likely if your stomach is empty. Ask your health care provider about changing or stopping your regular medicines. This is especially important if you are taking diabetes medicines, blood thinners, or medicines to treat problems with erections (erectile dysfunction). What happens during the procedure?  Hair on your chest may need to be removed so that small sticky patches called electrodes can be placed on your chest. These will transmit information that helps to monitor your heart during the procedure. An IV will be inserted into one of your veins. You might be given a medicine to control your heart rate during the procedure. This will help to ensure that good images are obtained. You will be asked to lie on an exam table. This table will slide in and out of the CT machine during the procedure. Contrast dye will be injected into the IV. You might feel warm, or you may get a metallic taste in your mouth. You will be given a medicine called nitroglycerin. This will relax or dilate the arteries in your heart. The table that you are lying on will move into the CT machine tunnel for the scan. The person running the machine will give you instructions while the scans are being done. You may be asked to: Keep your arms above your head. Hold your breath. Stay very still, even if the table is moving. When the scanning is complete, you will be moved out of the machine. The IV will be removed. The procedure may vary among health care providers and hospitals. What can I expect after the procedure? After your procedure, it is common to have: A metallic taste in your mouth from the contrast dye. A feeling of warmth. A headache from the nitroglycerin. Follow these instructions at home: Take over-the-counter and prescription medicines only as told by your health care provider. If you are told, drink enough fluid to keep your urine pale yellow. This will help to flush the contrast dye out of  your body. Most people can return to their normal activities right after the procedure. Ask your health care provider what activities are safe for you. It is up to you to get the results of your procedure. Ask your health care provider, or the department that is doing the procedure, when your results will be ready. Keep all follow-up visits as told by your health care provider. This is important. Contact a health care provider if: You have any symptoms of allergy to the contrast dye. These include: Shortness of breath. Rash or hives. A racing heartbeat. Summary A cardiac CT angiogram is a procedure to look at the heart and the area around the heart. It may be done to help find the cause of chest pains or other symptoms of heart disease. During this procedure, a large X-ray machine, called a CT scanner, takes detailed pictures of the heart and the surrounding area after a contrast dye has been injected into blood vessels in the area. Ask your health care provider about changing or  stopping your regular medicines before the procedure. This is especially important if you are taking diabetes medicines, blood thinners, or medicines to treat erectile dysfunction. If you are told, drink enough fluid to keep your urine pale yellow. This will help to flush the contrast dye out of your body. This information is not intended to replace advice given to you by your health care provider. Make sure you discuss any questions you have with your health care provider. Document Revised: 03/16/2022 Document Reviewed: 07/23/2019 Elsevier Patient Education  [place].

## 2023-01-04 ENCOUNTER — Telehealth (HOSPITAL_COMMUNITY): Payer: Self-pay | Admitting: *Deleted

## 2023-01-04 NOTE — Telephone Encounter (Signed)
Reaching out to patient to offer assistance regarding upcoming cardiac imaging study; pt verbalizes understanding of appt date/time, parking situation and where to check in, pre-test NPO status and medications ordered, and verified current allergies; name and call back number provided for further questions should they arise  Eugene Clement RN Navigator Cardiac Imaging [facility] [phone] office [phone] cell  Patient to take '25mg'$  metoprolol tartrate two hours prior to his cardiac CT scan.  He is aware to arrive at 11:30am.

## 2023-01-05 ENCOUNTER — Ambulatory Visit (HOSPITAL_COMMUNITY)
Admission: RE | Admit: 2023-01-05 | Discharge: 2023-01-05 | Disposition: A | Discharge status: Home / Self Care | Payer: Medicare Other | Source: Ambulatory Visit | Attending: Cardiology | Admitting: Cardiology

## 2023-01-05 DIAGNOSIS — I2089 Other forms of angina pectoris: Secondary | ICD-10-CM | POA: Insufficient documentation

## 2023-01-05 DIAGNOSIS — R079 Chest pain, unspecified: Secondary | ICD-10-CM | POA: Diagnosis present

## 2023-01-05 LAB — BASIC METABOLIC PANEL
BUN/Creatinine Ratio: 10 (ref 10–24)
BUN: 11 mg/dL (ref 8–27)
CO2: 20 mmol/L (ref 20–29)
Calcium: 9.5 mg/dL (ref 8.6–10.2)
Chloride: 102 mmol/L (ref 96–106)
Creatinine, Ser: 1.1 mg/dL (ref 0.76–1.27)
Glucose: 107 mg/dL — ABNORMAL HIGH (ref 70–99)
Potassium: 3.9 mmol/L (ref 3.5–5.2)
Sodium: 140 mmol/L (ref 134–144)
eGFR: 74 mL/min/{1.73_m2} (ref >59)

## 2023-01-05 LAB — LIPID PANEL
Chol/HDL Ratio: 5.6 ratio — ABNORMAL HIGH (ref 0.0–5.0)
Cholesterol, Total: 186 mg/dL (ref 100–199)
HDL: 33 mg/dL — ABNORMAL LOW (ref >39)
LDL Chol Calc (NIH): 134 mg/dL — ABNORMAL HIGH (ref 0–99)
Triglycerides: 102 mg/dL (ref 0–149)
VLDL Cholesterol Cal: 19 mg/dL (ref 5–40)

## 2023-01-05 MED ORDER — NITROGLYCERIN 0.4 MG SL SUBL
0.8000 mg | SUBLINGUAL_TABLET | Freq: Once | SUBLINGUAL | Status: AC
  Administered 2023-01-05: 0.8 mg via SUBLINGUAL

## 2023-01-05 MED ORDER — IOHEXOL 350 MG/ML SOLN
100.0000 mL | Freq: Once | INTRAVENOUS | Status: AC | PRN
  Administered 2023-01-05: 100 mL via INTRAVENOUS

## 2023-01-05 MED ORDER — NITROGLYCERIN 0.4 MG SL SUBL
SUBLINGUAL_TABLET | SUBLINGUAL | Status: AC
  Filled 2023-01-05: qty 2

## 2023-01-19 ENCOUNTER — Telehealth (HOSPITAL_BASED_OUTPATIENT_CLINIC_OR_DEPARTMENT_OTHER): Payer: Self-pay

## 2023-01-19 NOTE — Telephone Encounter (Addendum)
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   Coronary calcium score of 1.37 with mild nonobstructive calcification. Recommend secondary prevention with Aspirin and Rosuvastatin. As discussed in lipid result, add rosuvastatin 35m QD with labs (FLP/LFT) in 2 months and f/u in 3 months.

## 2023-01-22 NOTE — Telephone Encounter (Signed)
2nd call attempt, no answer, left message to call back.      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    Coronary calcium score of 1.37 with mild nonobstructive calcification. Recommend secondary prevention with Aspirin and Rosuvastatin. As discussed in lipid result, add rosuvastatin 62m QD with labs (FLP/LFT) in 2 months and f/u in 3 months.

## 2023-01-23 NOTE — Telephone Encounter (Signed)
3rd call attempt, no answer, mail box is full, unable to leave message- results to be mailed to patient.

## 2023-01-30 ENCOUNTER — Encounter (HOSPITAL_BASED_OUTPATIENT_CLINIC_OR_DEPARTMENT_OTHER): Payer: Self-pay | Admitting: Cardiology

## 2023-01-30 ENCOUNTER — Ambulatory Visit (INDEPENDENT_AMBULATORY_CARE_PROVIDER_SITE_OTHER): Payer: Medicare Other | Admitting: Cardiology

## 2023-01-30 VITALS — BP 128/78 | HR 67 | Ht 71.0 in | Wt 174.8 lb

## 2023-01-30 DIAGNOSIS — Z712 Person consulting for explanation of examination or test findings: Secondary | ICD-10-CM | POA: Diagnosis not present

## 2023-01-30 DIAGNOSIS — R932 Abnormal findings on diagnostic imaging of liver and biliary tract: Secondary | ICD-10-CM | POA: Diagnosis not present

## 2023-01-30 DIAGNOSIS — Z7189 Other specified counseling: Secondary | ICD-10-CM | POA: Diagnosis not present

## 2023-01-30 DIAGNOSIS — R079 Chest pain, unspecified: Secondary | ICD-10-CM | POA: Diagnosis not present

## 2023-01-30 NOTE — Progress Notes (Addendum)
Cardiology Office Note:    Date:  01/30/2023   ID:  Eugene Marshall, DOB 1955/12/29, MRN [id number]  PCP:  Kristie Cowman, MD  Cardiologist:  Buford Dresser, MD  Referring MD: Kristie Cowman, MD   CC: follow up  History of Present Illness:    Eugene Marshall is a 67 y.o. male who is seen for follow up today. Initially seen for chest pain and diastolic dysfunction at the request of Kristie Cowman, MD .   At his initial visit he reported intermittent chest pressure. He had started aspirin PRN and it had helped his symptoms. He reported the sensation as dull but focal and was affected by palpitation. He acknowledged being told in the past that he has an abnormal heart rhythm. He did have a cath while in [place] [employer] in 2008 with no stent placement.   Today, he is accompanied by his daughter. He reports that he is not feeling bad. We discussed his coronary CT and what it indicates. We also discussed a small spot seen on his liver during the the CT. Discussed further testing to ensure that this is benign.   His daughter did mention that he has significant anxiety and it may be playing a role in the chest pain and palpitations that he was experiencing. He did acknowledge that he recently stopped cannabis use and since then he has not had any more palpitations.   He denies any palpitations, chest pain, shortness of breath, or peripheral edema. No lightheadedness, headaches, syncope, orthopnea, or PND.   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 To prevent blood clots for 30 days after surgery.   naproxen sodium (ALEVE) 220 MG tablet Take 220 mg by mouth as needed.   Vitamin D, Ergocalciferol, (DRISDOL) 1.25 MG (50000 UNIT) CAPS capsule Take 50,000 Units by mouth every 7 (seven) days.   No current facility-administered medications on file prior to visit.     Allergies:   Sulfacetamide and Penicillins   Social History   Tobacco Use   Smoking status: Never  Vaping Use   Vaping Use: Never used  Substance Use Topics   Alcohol use: Not Currently   Drug use: Yes    Types: Marijuana    Family History: Mother died age 5 of MI. Had high blood pressure. No other MI. Father is 64 and healthy other than dementia.   ROS:   Please see the history of present illness. All other systems are reviewed and negative.     EKGs/Labs/Other Studies Reviewed:    The following studies were reviewed today: Coronary CT 01/05/2023:  IMPRESSION: 1. Punctate calcification in the LCX, but otherwise, no evidence of CAD, CADRADS = 0.   2. Coronary calcium score of 1.37. This was 40th percentile for age and sex matched control.   3. Normal coronary origin with right dominance.   4. Consider non-coronary causes of chest pain.  EKG:  EKG is personally reviewed.   01/30/23: not ordered today  Recent Labs: 07/06/2022: Hemoglobin 16.5; Platelets 113 01/04/2023: BUN 11; Creatinine, Ser 1.10; Potassium 3.9; Sodium 140  Recent Lipid Panel    Component Value Date/Time   CHOL 186 01/04/2023 1347   TRIG 102 01/04/2023 1347   HDL 33 (L) 01/04/2023 1347   CHOLHDL 5.6 (H) 01/04/2023 1347   LDLCALC 134 (H) 01/04/2023 1347    Physical Exam:    VS:  BP 128/78 (BP Location: Left Arm, Patient Position: Sitting, Cuff Size: Normal)   Pulse 67   Ht 5' 11"$  (1.803 m)   Wt 174 lb 12.8 oz (79.3 kg)   SpO2 98%   BMI 24.38 kg/m     Wt Readings from Last 3 Encounters:  01/30/23  174 lb 12.8 oz (79.3 kg)  12/29/22 178 lb 1.6 oz (80.8 kg)  07/18/22 175 lb (79.4 kg)    GEN: Well nourished, well developed in no acute distress HEENT: Normal, moist mucous membranes NECK: No JVD CARDIAC: regular rhythm, normal S1 and S2, no rubs or gallops. No murmur. VASCULAR: Radial and DP pulses 2+ bilaterally. No carotid bruits RESPIRATORY:  Clear to auscultation without rales, wheezing or rhonchi  ABDOMEN: Soft, non-tender, non-distended MUSCULOSKELETAL:  Ambulates independently SKIN: Warm and dry, no edema NEUROLOGIC:  Alert and oriented x 3. No focal neuro deficits noted. PSYCHIATRIC:  Normal affect    ASSESSMENT:    1. Abnormal finding on imaging of liver   2. Chest pain of uncertain etiology   3. Encounter to discuss test results   4. Cardiac risk counseling    PLAN:    Abnormal finding of liver -will order u/s liver for further evaluation  Chest pain -reassuring cardiac CT -improved since stopping cannibis -reviewed red flag warning signs that need immediate medical attention  Cardiac risk counseling and prevention recommendations: -recommend heart healthy/Mediterranean diet, with whole grains, fruits, vegetable, fish, lean meats, nuts, and olive oil. Limit salt. -recommend moderate walking, 3-5 times/week for 30-50 minutes each session. Aim for at least 150 minutes.week. Goal should be pace of 3 miles/hours, or walking 1.5 miles in 30 minutes -recommend avoidance of tobacco products. Avoid excess alcohol.  Plan for follow up: I would be happy to see him back as needed  Buford Dresser, MD, PhD, [facility] at [facility] at [facility] [address], [place],  [address] [phone]   Medication Adjustments/Labs and Tests Ordered: Current medicines are reviewed at length with the patient today.  Concerns regarding medicines are outlined above.  Orders Placed  This Encounter  Procedures   US Abdomen Limited RUQ (LIVER/GB)   No orders of the defined types were placed in this encounter.   Patient Instructions  Medication Instructions:  Your physician recommends that you continue on your current medications as directed. Please refer to the Current Medication list given to you today.  *If you need a refill on your cardiac medications before your next appointment, please call your pharmacy*  Testing/Procedures: Your provider has recommended a liver ultrasound!    Follow-Up: At [facility], you and your health needs are our priority.  As part of our continuing mission to provide you with exceptional heart care, we have created designated Provider Care Teams.  These Care Teams include your primary Cardiologist (physician) and Advanced Practice Providers (APPs -  Physician Assistants and Nurse Practitioners) who all work together to  provide you with the care you need, when you need it.  We recommend signing up for the patient portal called "MyChart".  Sign up information is provided on this After Visit Summary.  MyChart is used to connect with patients for Virtual Visits (Telemedicine).  Patients are able to view lab/test results, encounter notes, upcoming appointments, etc.  Non-urgent messages can be sent to your provider as well.   To learn more about what you can do with MyChart, go to [email or web address].    Your next appointment:   Call us if you need [place]!      I,Jessica Ford,acting as a Neurosurgeon for [employer], MD.,have documented all relevant documentation on the behalf of Jodelle Red, MD,as directed by  Jodelle Red, MD while in the presence of Jodelle Red, MD.   I, Jodelle Red, MD, have reviewed all documentation for this visit. The documentation on 01/30/23 for the exam, diagnosis, procedures, and orders are all accurate and complete.   Signed, Jodelle Red, MD  PhD 01/30/2023 5:09 PM    [facility]   Addended to add: Plaque volume reviewed as part of DECIDE registry. Total plaque volume 31 mm3.  Did you add a medication? No  If yes, how many? NA  If no, reason? Reason for not adding med: Other discussed at visit with low calcium score, patient prefers not to take medication  Did you remove a medication? No  Did you increase the dosage of any medication? No  Did you decrease the dosage of any medication? No  Did you refer to a specialist (i.e. lipid clinic, preventive cardiology, endocrinology)? No  Has patient seen plaque report? No

## 2023-01-30 NOTE — Patient Instructions (Signed)
Medication Instructions:  Your physician recommends that you continue on your current medications as directed. Please refer to the Current Medication list given to you today.  *If you need a refill on your cardiac medications before your next appointment, please call your pharmacy*  Testing/Procedures: Your provider has recommended a liver ultrasound!    Follow-Up: At [facility], you and your health needs are our priority.  As part of our continuing mission to provide you with exceptional heart care, we have created designated Provider Care Teams.  These Care Teams include your primary Cardiologist (physician) and Advanced Practice Providers (APPs -  Physician Assistants and Nurse Practitioners) who all work together to provide you with the care you need, when you need it.  We recommend signing up for the patient portal called "MyChart".  Sign up information is provided on this After Visit Summary.  MyChart is used to connect with patients for Virtual Visits (Telemedicine).  Patients are able to view lab/test results, encounter notes, upcoming appointments, etc.  Non-urgent messages can be sent to your provider as well.   To learn more about what you can do with MyChart, go to [email or web address].    Your next appointment:   Call us if you need [place]!

## 2023-02-04 ENCOUNTER — Encounter (HOSPITAL_BASED_OUTPATIENT_CLINIC_OR_DEPARTMENT_OTHER): Payer: Self-pay | Admitting: Cardiology

## 2023-02-05 ENCOUNTER — Ambulatory Visit (HOSPITAL_COMMUNITY): Payer: Medicare Other | Attending: Cardiology

## 2023-02-07 ENCOUNTER — Telehealth (HOSPITAL_BASED_OUTPATIENT_CLINIC_OR_DEPARTMENT_OTHER): Payer: Self-pay | Admitting: Cardiology

## 2023-02-07 NOTE — Telephone Encounter (Signed)
Left message for patient to call and discuss rescheduling the UA abdomen ordered by Dr. Harrell Gave

## 2023-02-14 NOTE — Telephone Encounter (Signed)
Spoke with patient regarding the 03/07/23 9:00 am US abdomen scheduled at [facility]---arrival time is 8:30 am---1st floor admissions office for check in----NPO after midnight.  Will mail information to patient and he voiced his understanding.

## 2023-03-07 ENCOUNTER — Ambulatory Visit (HOSPITAL_COMMUNITY)
Admission: RE | Admit: 2023-03-07 | Discharge: 2023-03-07 | Disposition: A | Discharge status: Home / Self Care | Payer: Medicare Other | Source: Ambulatory Visit | Attending: Cardiology | Admitting: Cardiology

## 2023-03-07 DIAGNOSIS — R932 Abnormal findings on diagnostic imaging of liver and biliary tract: Secondary | ICD-10-CM | POA: Diagnosis not present

## 2023-05-28 ENCOUNTER — Other Ambulatory Visit: Payer: Self-pay | Admitting: Physician Assistant

## 2023-05-28 DIAGNOSIS — M5416 Radiculopathy, lumbar region: Secondary | ICD-10-CM

## 2023-06-23 ENCOUNTER — Ambulatory Visit
Admission: RE | Admit: 2023-06-23 | Discharge: 2023-06-23 | Disposition: A | Discharge status: Home / Self Care | Payer: Medicare Other | Source: Ambulatory Visit | Attending: Physician Assistant | Admitting: Physician Assistant

## 2023-06-23 DIAGNOSIS — M5416 Radiculopathy, lumbar region: Secondary | ICD-10-CM

## 2023-06-30 ENCOUNTER — Encounter (HOSPITAL_BASED_OUTPATIENT_CLINIC_OR_DEPARTMENT_OTHER): Payer: Self-pay

## 2023-06-30 ENCOUNTER — Emergency Department (HOSPITAL_BASED_OUTPATIENT_CLINIC_OR_DEPARTMENT_OTHER)
Admission: EM | Admit: 2023-06-30 | Discharge: 2023-06-30 | Disposition: A | Discharge status: Home / Self Care | Payer: Medicare Other | Source: Home / Self Care | Attending: Emergency Medicine | Admitting: Emergency Medicine

## 2023-06-30 ENCOUNTER — Emergency Department (HOSPITAL_BASED_OUTPATIENT_CLINIC_OR_DEPARTMENT_OTHER): Payer: Medicare Other

## 2023-06-30 DIAGNOSIS — S92411A Displaced fracture of proximal phalanx of right great toe, initial encounter for closed fracture: Secondary | ICD-10-CM

## 2023-06-30 DIAGNOSIS — W1849XA Other slipping, tripping and stumbling without falling, initial encounter: Secondary | ICD-10-CM | POA: Insufficient documentation

## 2023-06-30 DIAGNOSIS — Z8546 Personal history of malignant neoplasm of prostate: Secondary | ICD-10-CM | POA: Insufficient documentation

## 2023-06-30 DIAGNOSIS — S99921A Unspecified injury of right foot, initial encounter: Secondary | ICD-10-CM | POA: Diagnosis present

## 2023-06-30 DIAGNOSIS — Z7982 Long term (current) use of aspirin: Secondary | ICD-10-CM | POA: Insufficient documentation

## 2023-06-30 DIAGNOSIS — I10 Essential (primary) hypertension: Secondary | ICD-10-CM | POA: Diagnosis not present

## 2023-06-30 MED ORDER — IBUPROFEN 800 MG PO TABS
800.0000 mg | ORAL_TABLET | Freq: Once | ORAL | Status: AC
  Administered 2023-06-30: 800 mg via ORAL
  Filled 2023-06-30: qty 1

## 2023-06-30 NOTE — ED Triage Notes (Signed)
States trip and hit right foot.  Great toe deformity noted

## 2023-06-30 NOTE — Discharge Instructions (Addendum)
You were seen in the ER today for your toe injury.  You have broken your big toe. You were placed in a postoperative shoe.  You may bear weight on the heel of your foot, but should use the crutches for assistance.  Please schedule a follow-up appointment with the orthopedic surgeon listed above in 1 to 2 weeks.  You may use Tylenol or NSAID medication such as ibuprofen or naproxen as needed discomfort in the toe.  Keep elevated when resting and follow-up with your primary care doctor as needed.  Return to ER with any new severe symptoms.

## 2023-06-30 NOTE — ED Provider Notes (Signed)
[place] EMERGENCY DEPARTMENT AT [facility] Provider Note   CSN: [id number] Arrival date & time: 06/30/23  1611     History  Chief Complaint  Patient presents with   Toe Injury    Eugene Marshall is a 67 y.o. male who presents to the emergency department after tripping and stubbing his toe in his home this evening.  Reports deformity to the great toe of the right foot.  No numbness or tingling in the foot.  Patient ambulatory but with significant pain in the toe with ambulation.  I have reviewed his medical records.  He is history of hypertension, arthritis and prostate cancer.  He is not anticoagulated or currently undergoing cancer treatments.  HPI     Home Medications Prior to Admission medications   Medication Sig Start Date End Date Taking? Authorizing Provider  aspirin EC 81 MG tablet Take 1 tablet (81 mg total) by mouth 2 (two) times daily. To prevent blood clots for 30 days after surgery. 07/19/22   Jenne Pane, PA-C  naproxen sodium (ALEVE) 220 MG tablet Take 220 mg by mouth as needed.    [provider]  Vitamin D, Ergocalciferol, (DRISDOL) 1.25 MG (50000 UNIT) CAPS capsule Take 50,000 Units by mouth every 7 (seven) days.    [provider]      Allergies    Sulfacetamide and Penicillins    Review of Systems   Review of Systems  Musculoskeletal:        Right great toe pain and deformity    Physical Exam Updated Vital Signs BP 130/84 (BP Location: Right Arm)   Pulse 80   Temp 98.2 F (36.8 C) (Oral)   Resp 18   Ht 5\' 11"  (1.803 m)   Wt 72.6 kg   SpO2 99%   BMI 22.32 kg/m  Physical Exam Vitals and nursing note reviewed.  Constitutional:      Appearance: He is not ill-appearing or toxic-appearing.  HENT:     Head: Normocephalic and atraumatic.  Eyes:     General: No scleral icterus.       Right eye: No discharge.        Left eye: No discharge.     Conjunctiva/sclera: Conjunctivae normal.  Pulmonary:     Effort:  Pulmonary effort is normal.  Musculoskeletal:       Feet:  Skin:    General: Skin is warm and dry.     Capillary Refill: Capillary refill takes less than 2 seconds.  Neurological:     General: No focal deficit present.     Mental Status: He is alert.  Psychiatric:        Mood and Affect: Mood normal.     ED Results / Procedures / Treatments   Labs (all labs ordered are listed, but only abnormal results are displayed) Labs Reviewed - No data to display  EKG None  Radiology DG Foot Complete Right  Result Date: 06/30/2023 CLINICAL DATA:  Trauma EXAM: RIGHT FOOT COMPLETE - 3 VIEW COMPARISON:  None Available. FINDINGS: Displaced fracture through the lateral aspect of the great toe proximal phalanx with intra articular extension into the IP joint. There is mild surrounding soft tissue swelling. No evidence of radiopaque foreign body. There is no evidence of arthropathy. IMPRESSION: Displaced fracture through the lateral aspect of the great toe proximal phalanx with intra articular extension into the IP joint. Electronically Signed   By: Lorenza Cambridge M.D.   On: 06/30/2023 17:10    Procedures  Procedures    Medications Ordered in ED Medications  ibuprofen (ADVIL) tablet 800 mg (800 mg Oral Given 06/30/23 1828)    ED Course/ Medical Decision Making/ A&P Clinical Course as of 06/30/23 1850  Sat Jun 30, 2023  1822 Consulted Dr. Christell Constant, orthopedic surgeon who recommends postoperative shoe and crutches; weight bearing on the heel only.Unfortunately there is no good option to straighten the toe and splinted in place.  Recommends follow-up in the office with him in 1 to 2 weeks.  I appreciate his collaboration in the care of this patient. [RS]    Clinical Course User Index [RS] Paris Lore, PA-C                             Medical Decision Making 67 year old male with right great toe pain after tripping at home.  Normal hemodynamics on intake.  Deformed to the right great  toe with concern for distal phalanx having fallen laterally.    Amount and/or Complexity of Data Reviewed Radiology: ordered and independent interpretation performed.    Details: Fracture to the proximal phalanx of the right great toe on x-ray, agree with radiology interpretation.   Risk Prescription drug management.   Case discussed with orthopedics as above.  Patient placed on crutches and with postoperative shoe in the emergency department.  States that he has a walker at home.  Recommend OTC analgesia as needed and close outpatient follow-up with the orthopedist.  No further workup warranted in the ER at this time.  Tamarcus and his wife  voiced understanding of his medical evaluation and treatment plan. Each of their questions answered to their expressed satisfaction.  Return precautions were given.  Patient is well-appearing, stable, and was discharged in good condition.  This chart was dictated using voice recognition software, Dragon. Despite the best efforts of this provider to proofread and correct errors, errors may still occur which can change documentation meaning.    Final Clinical Impression(s) / ED Diagnoses Final diagnoses:  Closed displaced fracture of proximal phalanx of right great toe, initial encounter    Rx / DC Orders ED Discharge Orders     None         Sherrilee Gilles 06/30/23 1850    Benjiman Core, MD 07/01/23 1452

## 2023-07-05 ENCOUNTER — Encounter: Payer: Self-pay | Admitting: Physician Assistant

## 2023-07-05 ENCOUNTER — Ambulatory Visit: Payer: Medicare Other | Admitting: Orthopedic Surgery

## 2023-07-05 DIAGNOSIS — S92414A Nondisplaced fracture of proximal phalanx of right great toe, initial encounter for closed fracture: Secondary | ICD-10-CM | POA: Diagnosis not present

## 2023-07-13 ENCOUNTER — Ambulatory Visit (HOSPITAL_BASED_OUTPATIENT_CLINIC_OR_DEPARTMENT_OTHER): Payer: Medicare Other | Admitting: Cardiology

## 2023-07-13 ENCOUNTER — Encounter (HOSPITAL_BASED_OUTPATIENT_CLINIC_OR_DEPARTMENT_OTHER): Payer: Self-pay | Admitting: Cardiology

## 2023-07-13 VITALS — BP 137/94 | HR 83 | Ht 71.0 in | Wt 167.2 lb

## 2023-07-13 DIAGNOSIS — Z7189 Other specified counseling: Secondary | ICD-10-CM | POA: Diagnosis not present

## 2023-07-13 DIAGNOSIS — Z8249 Family history of ischemic heart disease and other diseases of the circulatory system: Secondary | ICD-10-CM

## 2023-07-13 DIAGNOSIS — R079 Chest pain, unspecified: Secondary | ICD-10-CM

## 2023-07-13 NOTE — Patient Instructions (Signed)
Medication Instructions:  The current medical regimen is effective;  continue present plan and medications.   *If you need a refill on your cardiac medications before your next appointment, please call your pharmacy*   Lab Work: None   Testing/Procedures: Your physician has requested that you have an echocardiogram. Echocardiography is a painless test that uses sound waves to create images of your heart. It provides your doctor with information about the size and shape of your heart and how well your heart's chambers and valves are working. This procedure takes approximately one hour. There are no restrictions for this procedure. Please do NOT wear cologne, perfume, aftershave, or lotions (deodorant is allowed). Please arrive 15 minutes prior to your appointment time.    Follow-Up: At [facility], you and your health needs are our priority.  As part of our continuing mission to provide you with exceptional heart care, we have created designated Provider Care Teams.  These Care Teams include your primary Cardiologist (physician) and Advanced Practice Providers (APPs -  Physician Assistants and Nurse Practitioners) who all work together to provide you with the care you need, when you need it.  We recommend signing up for the patient portal called "MyChart".  Sign up information is provided on this After Visit Summary.  MyChart is used to connect with patients for Virtual Visits (Telemedicine).  Patients are able to view lab/test results, encounter notes, upcoming appointments, etc.  Non-urgent messages can be sent to your provider as well.   To learn more about what you can do with MyChart, go to [email or web address].    Your next appointment:   3 month(s)  Provider:   Jodelle Red, MD    Other Instructions None

## 2023-07-13 NOTE — Progress Notes (Signed)
Cardiology Office Note:  .    Date:  07/13/2023  ID:  Eugene Marshall, DOB Jan 03, 1956, MRN [id number] PCP: Knox Royalty, MD  [facility] Providers Cardiologist:  Jodelle Red, MD     History of Present Illness: [name] Kitchen    Eugene Marshall is a 67 y.o. male who is seen for follow up today. Initially seen 12/29/2022 for chest pain and diastolic dysfunction at the request of Knox Royalty, MD .    At his initial visit he reported intermittent chest pressure. He had started aspirin PRN and it had helped his symptoms. He reported the sensation as dull but focal and was affected by palpitation. He acknowledged being told in the past that he has an abnormal heart rhythm. He did have a cath while in the [employer] in 2008 with no stent placement.    At his visit 01/2023, we discussed his coronary CT and what it indicated. We also discussed a small spot seen on his liver during the the CT. Discussed further testing to ensure that this is benign. His daughter did mention that he has significant anxiety and it may be playing a role in the chest pain and palpitations that he was experiencing. He did acknowledge that he had recently stopped cannabis use and since then he had no further palpitations.    He presented to the ED 06/30/2023 following a mechanical fall and was treated for closed displaced fracture of proximal phalanx of right great toe.  Today, he is accompanied by two family members. He complains of intermittent chest fullness/heaviness, possibly bilaterally. He describes this as "feeling uncomfortable" and feeling like something is wrong. Has been occurring a couple times per week, not every day. Onset seems to be random, either at rest or while he was working out the other day. When it occurs his chest discomfort typically stays constant until he takes an 81 mg ASA with improvement in his pain.  He states that there was recent concern for COPD but reportedly his lungs were clear. He denies  issues with cough or sputum production.  Has had issues with anxiety in the past, prior panic attacks in the 1980's. Some cannabis use.  At times he complains of mild dizziness or lightheadedness. No loss of consciousness.  Not much strenuous activity lately, has stairs at home that he hasn't used. He works out at home inconsistently.  Regarding his diet, he drinks a lot of sweet tea.  He denies any palpitations, peripheral edema, headaches, orthopnea, or PND.  ROS:  Please see the history of present illness. ROS otherwise negative except as noted.  (+) Chest fullness/heaviness  Studies Reviewed: [name] Kitchen         Physical Exam:    VS:  BP (!) 137/94 (BP Location: Left Arm, Patient Position: Sitting, Cuff Size: Normal)   Pulse 83   Ht 5\' 11"  (1.803 m)   Wt 167 lb 3.2 oz (75.8 kg)   SpO2 95%   BMI 23.32 kg/m    Wt Readings from Last 3 Encounters:  07/13/23 167 lb 3.2 oz (75.8 kg)  06/30/23 160 lb (72.6 kg)  01/30/23 174 lb 12.8 oz (79.3 kg)    GEN: Well nourished, well developed in no acute distress HEENT: Normal, moist mucous membranes NECK: No JVD CARDIAC: regular rhythm, normal S1 and S2, no rubs or gallops. No murmur. VASCULAR: Radial and DP pulses 2+ bilaterally. No carotid bruits RESPIRATORY:  Clear to auscultation without rales, wheezing or rhonchi  ABDOMEN: Soft, non-tender, non-distended  MUSCULOSKELETAL:  Ambulates independently SKIN: Warm and dry, no edema NEUROLOGIC:  Alert and oriented x 3. No focal neuro deficits noted. PSYCHIATRIC:  Normal affect   ASSESSMENT AND PLAN: .    Chest pain -reassuring cardiac CT -will get echo  -reviewed red flag warning signs that need immediate medical attention   Cardiac risk counseling and prevention recommendations: family history of heart disease -recommend heart healthy/Mediterranean diet, with whole grains, fruits, vegetable, fish, lean meats, nuts, and olive oil. Limit salt. -recommend moderate walking, 3-5 times/week  for 30-50 minutes each session. Aim for at least 150 minutes.week. Goal should be pace of 3 miles/hours, or walking 1.5 miles in 30 minutes -recommend avoidance of tobacco products. Avoid excess alcohol.  Dispo: Follow-up in 3 months, or sooner as needed.  I,Mathew Stumpf,acting as a Neurosurgeon for [employer], MD.,have documented all relevant documentation on the behalf of Jodelle Red, MD,as directed by  Jodelle Red, MD while in the presence of Jodelle Red, MD.  I, Jodelle Red, MD, have reviewed all documentation for this visit. The documentation on 09/02/23 for the exam, diagnosis, procedures, and orders are all accurate and complete.   Signed, Jodelle Red, MD

## 2023-07-18 ENCOUNTER — Encounter: Payer: Self-pay | Admitting: Orthopedic Surgery

## 2023-07-18 NOTE — Progress Notes (Signed)
Office Visit Note   Patient: Eugene Marshall           Date of Birth: [birth year]           MRN: [id number] Visit Date: 07/05/2023              Requested by: Knox Royalty, MD [address] [place],  [place] [address] PCP: Knox Royalty, MD  No chief complaint on file.     HPI: Patient is a 67 year old gentleman who is seen for initial evaluation for a closed fracture proximal phalanx right great toe.  Patient has been on his foot and has increased pain at this time from swelling.  Assessment & Plan: Visit Diagnoses:  1. Closed nondisplaced fracture of proximal phalanx of right great toe, initial encounter     Plan: The toes are not overlapping there is no extension or flexion deformity.  There is a valgus deformity.  Discussed that we could proceed with surgical intervention or treat nonoperatively in a postoperative shoe.  Patient states he would like to continue with nonoperative treatment.  Follow-Up Instructions: Return if symptoms worsen or fail to improve.   Ortho Exam  Patient is alert, oriented, no adenopathy, well-dressed, normal affect, normal respiratory effort. Examination patient has good pulses.  There is a fracture of the IP joint great toe.  There is some valgus deformity but no overlapping of the toes.  Imaging: No results found. No images are attached to the encounter.  Labs: No results found for: "HGBA1C", "ESRSEDRATE", "CRP", "LABURIC", "REPTSTATUS", "GRAMSTAIN", "CULT", "LABORGA"   No results found for: "ALBUMIN", "PREALBUMIN", "CBC"  No results found for: "MG" No results found for: "VD25OH"  No results found for: "PREALBUMIN"    Latest Ref Rng & Units 07/06/2022    9:45 AM 01/05/2019    2:11 AM 08/20/2010    8:31 AM  CBC EXTENDED  WBC 4.0 - 10.5 K/uL 5.9  6.8    RBC 4.22 - 5.81 MIL/uL 5.74  5.66    Hemoglobin 13.0 - 17.0 g/dL 19.1  47.8  29.5   HCT 39.0 - 52.0 % 47.9  47.5  50.0   Platelets 150 - 400 K/uL 113  139       There is no height or  weight on file to calculate BMI.  Orders:  No orders of the defined types were placed in this encounter.  No orders of the defined types were placed in this encounter.    Procedures: No procedures performed  Clinical Data: No additional findings.  ROS:  All other systems negative, except as noted in the HPI. Review of Systems  Objective: Vital Signs: There were no vitals taken for this visit.  Specialty Comments:  No specialty comments available.  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 No pertinent family history.  Past Surgical History:  Procedure Laterality Date   CHOLECYSTECTOMY     PROSTATECTOMY  07/2017   TOTAL HIP ARTHROPLASTY Right 07/18/2022   Procedure: TOTAL HIP ARTHROPLASTY ANTERIOR APPROACH;  Surgeon: Sheral Apley, MD;  Location: [facility] ORS;  Service: Orthopedics;  Laterality: Right;   Social History   Occupational History   Not on file  Tobacco Use   Smoking status: Never   Smokeless tobacco: Not on file  Vaping Use   Vaping status: Never Used  Substance and Sexual Activity   Alcohol use: Not Currently   Drug use: Yes    Types: Marijuana   Sexual activity: Not on file

## 2023-09-02 ENCOUNTER — Encounter (HOSPITAL_BASED_OUTPATIENT_CLINIC_OR_DEPARTMENT_OTHER): Payer: Self-pay | Admitting: Cardiology

## 2023-10-18 ENCOUNTER — Telehealth: Payer: Self-pay | Admitting: Cardiology

## 2023-10-18 ENCOUNTER — Ambulatory Visit (INDEPENDENT_AMBULATORY_CARE_PROVIDER_SITE_OTHER): Payer: Medicare Other | Admitting: Cardiology

## 2023-10-18 ENCOUNTER — Encounter (HOSPITAL_BASED_OUTPATIENT_CLINIC_OR_DEPARTMENT_OTHER): Payer: Self-pay | Admitting: Cardiology

## 2023-10-18 VITALS — BP 122/74 | HR 72 | Ht 71.0 in | Wt 176.4 lb

## 2023-10-18 DIAGNOSIS — I2510 Atherosclerotic heart disease of native coronary artery without angina pectoris: Secondary | ICD-10-CM

## 2023-10-18 DIAGNOSIS — R079 Chest pain, unspecified: Secondary | ICD-10-CM | POA: Diagnosis not present

## 2023-10-18 DIAGNOSIS — Z7189 Other specified counseling: Secondary | ICD-10-CM | POA: Diagnosis not present

## 2023-10-18 DIAGNOSIS — R002 Palpitations: Secondary | ICD-10-CM

## 2023-10-18 NOTE — Progress Notes (Signed)
Cardiology Office Note:  .    Date:  10/18/2023  ID:  Cena Benton, DOB 26-Feb-1956, MRN [id number] PCP: Knox Royalty, MD  [facility] Providers Cardiologist:  Jodelle Red, MD     History of Present Illness: [name] Kitchen    Eugene Marshall is a 67 y.o. male with minimal nonobstructive CAD (ca score 1, no plaque), history of chest pain and palpitations, history of prostate cancer s/p prostatectomy who is seen for follow up today. Initially seen 12/29/2022 for chest pain and diastolic dysfunction at the request of Knox Royalty, MD .    At his initial visit he reported intermittent chest pressure. He had started aspirin PRN and it had helped his symptoms. He reported the sensation as dull but focal and was affected by palpitation. He acknowledged being told in the past that he has an abnormal heart rhythm. He did have a cath while in the [employer] in 2008 with no stent placement.    At his visit 01/2023, we discussed his coronary CT and what it indicated. We also discussed a small spot seen on his liver during the the CT. Discussed further testing to ensure that this is benign. His daughter did mention that he has significant anxiety and it may be playing a role in the chest pain and palpitations that he was experiencing. He did acknowledge that he had recently stopped cannabis use and since then he had no further palpitations.    Today: Overall doing well. Hasn't been pushing himself but no limitations. Under some stress as he is separating form his wife. Routinely does cardiovascular exercise and lift weights without symptoms.  Denies chest pain, shortness of breath at rest or with normal exertion. No PND, orthopnea, LE edema or unexpected weight gain. No syncope or palpitations. ROS otherwise negative except as noted.   ROS:  Please see the history of present illness. ROS otherwise negative except as noted.   Studies Reviewed: [name] Kitchen         Physical Exam:    VS:  BP 122/74   Pulse 72    Ht 5\' 11"  (1.803 m)   Wt 176 lb 6.4 oz (80 kg)   BMI 24.60 kg/m    Wt Readings from Last 3 Encounters:  10/18/23 176 lb 6.4 oz (80 kg)  07/13/23 167 lb 3.2 oz (75.8 kg)  06/30/23 160 lb (72.6 kg)    GEN: Well nourished, well developed in no acute distress HEENT: Normal, moist mucous membranes NECK: No JVD CARDIAC: regular rhythm, normal S1 and S2, no rubs or gallops. No murmur. VASCULAR: Radial and DP pulses 2+ bilaterally. No carotid bruits RESPIRATORY:  Clear to auscultation without rales, wheezing or rhonchi  ABDOMEN: Soft, non-tender, non-distended MUSCULOSKELETAL:  Ambulates independently SKIN: Warm and dry, no edema NEUROLOGIC:  Alert and oriented x 3. No focal neuro deficits noted. PSYCHIATRIC:  Normal affect   ASSESSMENT AND PLAN: .    Chest pain Palpitations Minimal nonobstructive CAD -reassuring cardiac CT, calcium score 1, no stenosis or significant plaque -no recent symptoms -tolerating aspirin and atorvastatin. Due for lipid recheck but reports he just had this done at [facility] with Dr. Yetta Barre. Will have records faxed -reviewed red flag warning signs that need immediate medical attention   Cardiac risk counseling and prevention recommendations: family history of heart disease -recommend heart healthy/Mediterranean diet, with whole grains, fruits, vegetable, fish, lean meats, nuts, and olive oil. Limit salt. -recommend moderate walking, 3-5 times/week for 30-50 minutes each session. Aim for at least  150 minutes.week. Goal should be pace of 3 miles/hours, or walking 1.5 miles in 30 minutes -recommend avoidance of tobacco products. Avoid excess alcohol.  Dispo: Follow-up in 12 months, or sooner as needed.  Signed, Jodelle Red, MD

## 2023-10-18 NOTE — Telephone Encounter (Signed)
Patient called stating we would have to call [facility] to get the copy of his last physical from them.  He said there number is [phone].

## 2023-10-18 NOTE — Telephone Encounter (Signed)
Requested records.

## 2023-10-18 NOTE — Patient Instructions (Signed)
Medication Instructions:  Your physician recommends that you continue on your current medications as directed. Please refer to the Current Medication list given to you today.  *If you need a refill on your cardiac medications before your next appointment, please call your pharmacy*  Lab Work:  Follow-Up: At [facility], you and your health needs are our priority.  As part of our continuing mission to provide you with exceptional heart care, we have created designated Provider Care Teams.  These Care Teams include your primary Cardiologist (physician) and Advanced Practice Providers (APPs -  Physician Assistants and Nurse Practitioners) who all work together to provide you with the care you need, when you need it.  We recommend signing up for the patient portal called "MyChart".  Sign up information is provided on this After Visit Summary.  MyChart is used to connect with patients for Virtual Visits (Telemedicine).  Patients are able to view lab/test results, encounter notes, upcoming appointments, etc.  Non-urgent messages can be sent to your provider as well.   To learn more about what you can do with MyChart, go to [email or web address].    Your next appointment:   Follow up with Dr. Cristal Deer in 12 months  Other Information: Here is our fax number: [phone]

## 2023-11-06 NOTE — Telephone Encounter (Signed)
Records from [facility] are under Media.

## 2023-11-06 NOTE — Telephone Encounter (Signed)
Will forward to Dr Cristal Deer for review

## 2025-01-05 ENCOUNTER — Ambulatory Visit: Admitting: Neurology

## 2025-02-10 ENCOUNTER — Ambulatory Visit: Admitting: Neurology
